# Patient Record
Sex: Male | Born: 2000 | Race: Black or African American | Hispanic: No | Marital: Single | Smoking: Never smoker
Health system: Southern US, Community
[De-identification: ages and names within clinical notes are randomized; demographics above are authoritative.]

## PROBLEM LIST (undated history)

## (undated) DIAGNOSIS — N35919 Unspecified urethral stricture, male, unspecified site: Secondary | ICD-10-CM

## (undated) DIAGNOSIS — G43909 Migraine, unspecified, not intractable, without status migrainosus: Secondary | ICD-10-CM

## (undated) HISTORY — PX: ANTERIOR CRUCIATE LIGAMENT REPAIR: SHX115

---

## 2019-03-08 DIAGNOSIS — W3400XA Accidental discharge from unspecified firearms or gun, initial encounter: Secondary | ICD-10-CM

## 2019-03-08 HISTORY — DX: Accidental discharge from unspecified firearms or gun, initial encounter: W34.00XA

## 2019-03-12 ENCOUNTER — Emergency Department (HOSPITAL_COMMUNITY): Payer: Medicaid Other | Admitting: Anesthesiology

## 2019-03-12 ENCOUNTER — Inpatient Hospital Stay (HOSPITAL_COMMUNITY): Payer: Medicaid Other

## 2019-03-12 ENCOUNTER — Other Ambulatory Visit: Payer: Self-pay

## 2019-03-12 ENCOUNTER — Emergency Department (HOSPITAL_COMMUNITY): Payer: Medicaid Other

## 2019-03-12 ENCOUNTER — Inpatient Hospital Stay (HOSPITAL_COMMUNITY)
Admission: EM | Admit: 2019-03-12 | Discharge: 2019-03-20 | DRG: 957 | Disposition: A | Discharge status: Home Health | Payer: Medicaid Other | Attending: Surgery | Admitting: Surgery

## 2019-03-12 ENCOUNTER — Encounter (HOSPITAL_COMMUNITY): Admission: EM | Disposition: A | Discharge status: Home Health | Payer: Self-pay | Source: Home / Self Care

## 2019-03-12 DIAGNOSIS — N179 Acute kidney failure, unspecified: Secondary | ICD-10-CM | POA: Diagnosis present

## 2019-03-12 DIAGNOSIS — W3400XA Accidental discharge from unspecified firearms or gun, initial encounter: Secondary | ICD-10-CM | POA: Diagnosis not present

## 2019-03-12 DIAGNOSIS — S3720XA Unspecified injury of bladder, initial encounter: Secondary | ICD-10-CM | POA: Diagnosis present

## 2019-03-12 DIAGNOSIS — Z9911 Dependence on respirator [ventilator] status: Secondary | ICD-10-CM | POA: Diagnosis not present

## 2019-03-12 DIAGNOSIS — Z20828 Contact with and (suspected) exposure to other viral communicable diseases: Secondary | ICD-10-CM | POA: Diagnosis present

## 2019-03-12 DIAGNOSIS — S32509A Unspecified fracture of unspecified pubis, initial encounter for closed fracture: Secondary | ICD-10-CM | POA: Diagnosis present

## 2019-03-12 DIAGNOSIS — R402362 Coma scale, best motor response, obeys commands, at arrival to emergency department: Secondary | ICD-10-CM | POA: Diagnosis present

## 2019-03-12 DIAGNOSIS — S36409A Unspecified injury of unspecified part of small intestine, initial encounter: Secondary | ICD-10-CM | POA: Diagnosis present

## 2019-03-12 DIAGNOSIS — S31649A Puncture wound with foreign body of abdominal wall, unspecified quadrant with penetration into peritoneal cavity, initial encounter: Principal | ICD-10-CM | POA: Diagnosis present

## 2019-03-12 DIAGNOSIS — R402142 Coma scale, eyes open, spontaneous, at arrival to emergency department: Secondary | ICD-10-CM | POA: Diagnosis present

## 2019-03-12 DIAGNOSIS — S3720XD Unspecified injury of bladder, subsequent encounter: Secondary | ICD-10-CM

## 2019-03-12 DIAGNOSIS — D62 Acute posthemorrhagic anemia: Secondary | ICD-10-CM | POA: Diagnosis not present

## 2019-03-12 DIAGNOSIS — R402113 Coma scale, eyes open, never, at hospital admission: Secondary | ICD-10-CM | POA: Diagnosis present

## 2019-03-12 DIAGNOSIS — R402252 Coma scale, best verbal response, oriented, at arrival to emergency department: Secondary | ICD-10-CM | POA: Diagnosis present

## 2019-03-12 DIAGNOSIS — R42 Dizziness and giddiness: Secondary | ICD-10-CM | POA: Diagnosis present

## 2019-03-12 DIAGNOSIS — R402343 Coma scale, best motor response, flexion withdrawal, at hospital admission: Secondary | ICD-10-CM | POA: Diagnosis present

## 2019-03-12 DIAGNOSIS — J9601 Acute respiratory failure with hypoxia: Secondary | ICD-10-CM | POA: Diagnosis not present

## 2019-03-12 DIAGNOSIS — R402233 Coma scale, best verbal response, inappropriate words, at hospital admission: Secondary | ICD-10-CM | POA: Diagnosis present

## 2019-03-12 DIAGNOSIS — S31814A Puncture wound with foreign body of right buttock, initial encounter: Secondary | ICD-10-CM | POA: Diagnosis present

## 2019-03-12 DIAGNOSIS — J939 Pneumothorax, unspecified: Secondary | ICD-10-CM

## 2019-03-12 DIAGNOSIS — K659 Peritonitis, unspecified: Secondary | ICD-10-CM | POA: Diagnosis present

## 2019-03-12 DIAGNOSIS — J9690 Respiratory failure, unspecified, unspecified whether with hypoxia or hypercapnia: Secondary | ICD-10-CM

## 2019-03-12 DIAGNOSIS — Z9289 Personal history of other medical treatment: Secondary | ICD-10-CM

## 2019-03-12 DIAGNOSIS — S31139A Puncture wound of abdominal wall without foreign body, unspecified quadrant without penetration into peritoneal cavity, initial encounter: Secondary | ICD-10-CM

## 2019-03-12 DIAGNOSIS — J9811 Atelectasis: Secondary | ICD-10-CM

## 2019-03-12 DIAGNOSIS — Z01818 Encounter for other preprocedural examination: Secondary | ICD-10-CM

## 2019-03-12 HISTORY — PX: PROCTOSCOPY: SHX2266

## 2019-03-12 HISTORY — PX: BOWEL RESECTION: SHX1257

## 2019-03-12 HISTORY — PX: ILEO LOOP DIVERSION: SHX1780

## 2019-03-12 HISTORY — PX: APPLICATION OF WOUND VAC: SHX5189

## 2019-03-12 HISTORY — PX: LAPAROTOMY: SHX154

## 2019-03-12 HISTORY — PX: BLADDER REPAIR: SHX6721

## 2019-03-12 LAB — CBC
HCT: 41.1 % (ref 39.0–52.0)
Hemoglobin: 14.1 g/dL (ref 13.0–17.0)
MCH: 29.7 pg (ref 26.0–34.0)
MCHC: 34.3 g/dL (ref 30.0–36.0)
MCV: 86.5 fL (ref 80.0–100.0)
Platelets: 212 10*3/uL (ref 150–400)
RBC: 4.75 MIL/uL (ref 4.22–5.81)
RDW: 12.6 % (ref 11.5–15.5)
WBC: 9.8 10*3/uL (ref 4.0–10.5)
nRBC: 0 % (ref 0.0–0.2)

## 2019-03-12 LAB — PROTIME-INR
INR: 3.2 — ABNORMAL HIGH (ref 0.8–1.2)
Prothrombin Time: 31.9 seconds — ABNORMAL HIGH (ref 11.4–15.2)

## 2019-03-12 LAB — SARS CORONAVIRUS 2 BY RT PCR (HOSPITAL ORDER, PERFORMED IN ~~LOC~~ HOSPITAL LAB): SARS Coronavirus 2: NEGATIVE

## 2019-03-12 LAB — SAMPLE TO BLOOD BANK

## 2019-03-12 LAB — ETHANOL: Alcohol, Ethyl (B): <10 mg/dL (ref <10)

## 2019-03-12 LAB — CDS SEROLOGY

## 2019-03-12 LAB — LACTIC ACID, PLASMA: Lactic Acid, Venous: 2.9 mmol/L (ref 0.5–1.9)

## 2019-03-12 SURGERY — LAPAROTOMY, EXPLORATORY
Anesthesia: General | Site: Rectum

## 2019-03-12 MED ORDER — SUCCINYLCHOLINE 20MG/ML (10ML) SYRINGE FOR MEDFUSION PUMP - OPTIME
INTRAMUSCULAR | Status: DC | PRN
  Administered 2019-03-12: 100 mg via INTRAVENOUS

## 2019-03-12 MED ORDER — FENTANYL CITRATE (PF) 250 MCG/5ML IJ SOLN
INTRAMUSCULAR | Status: AC
  Filled 2019-03-12: qty 5

## 2019-03-12 MED ORDER — SODIUM CHLORIDE 0.9 % IV SOLN
INTRAVENOUS | Status: DC | PRN
  Administered 2019-03-12: 21:00:00 via INTRAVENOUS

## 2019-03-12 MED ORDER — HYDROMORPHONE HCL 1 MG/ML IJ SOLN
INTRAMUSCULAR | Status: AC
  Administered 2019-03-12: 1 mg via INTRAVENOUS
  Filled 2019-03-12: qty 1

## 2019-03-12 MED ORDER — HEMOSTATIC AGENTS (NO CHARGE) OPTIME
TOPICAL | Status: DC | PRN
  Administered 2019-03-12: 1

## 2019-03-12 MED ORDER — CEFAZOLIN SODIUM-DEXTROSE 2-3 GM-%(50ML) IV SOLR
INTRAVENOUS | Status: DC | PRN
  Administered 2019-03-12: 2 g via INTRAVENOUS

## 2019-03-12 MED ORDER — ONDANSETRON HCL 4 MG/2ML IJ SOLN
4.0000 mg | Freq: Four times a day (QID) | INTRAMUSCULAR | Status: DC | PRN
  Administered 2019-03-14 – 2019-03-19 (×5): 4 mg via INTRAVENOUS
  Filled 2019-03-12 (×5): qty 2

## 2019-03-12 MED ORDER — SODIUM CHLORIDE 0.9 % IV SOLN
INTRAVENOUS | Status: DC
  Administered 2019-03-12 – 2019-03-19 (×13): via INTRAVENOUS

## 2019-03-12 MED ORDER — ROCURONIUM 10MG/ML (10ML) SYRINGE FOR MEDFUSION PUMP - OPTIME
INTRAVENOUS | Status: DC | PRN
  Administered 2019-03-12: 50 mg via INTRAVENOUS
  Administered 2019-03-12: 30 mg via INTRAVENOUS
  Administered 2019-03-12: 20 mg via INTRAVENOUS

## 2019-03-12 MED ORDER — HYDROMORPHONE HCL 1 MG/ML IJ SOLN
1.0000 mg | Freq: Once | INTRAMUSCULAR | Status: AC
  Administered 2019-03-12: 21:00:00 1 mg via INTRAVENOUS

## 2019-03-12 MED ORDER — CHLORHEXIDINE GLUCONATE CLOTH 2 % EX PADS
6.0000 | MEDICATED_PAD | Freq: Every day | CUTANEOUS | Status: DC
  Administered 2019-03-13 – 2019-03-20 (×7): 6 via TOPICAL

## 2019-03-12 MED ORDER — HEMOSTATIC AGENTS (NO CHARGE) OPTIME
TOPICAL | Status: DC | PRN
  Administered 2019-03-12 (×3): 1 via TOPICAL
  Administered 2019-03-12: 1

## 2019-03-12 MED ORDER — MIDAZOLAM HCL 2 MG/2ML IJ SOLN
INTRAMUSCULAR | Status: DC | PRN
  Administered 2019-03-12: 2 mg via INTRAVENOUS

## 2019-03-12 MED ORDER — ORAL CARE MOUTH RINSE
15.0000 mL | OROMUCOSAL | Status: DC
  Administered 2019-03-12 – 2019-03-15 (×24): 15 mL via OROMUCOSAL

## 2019-03-12 MED ORDER — FENTANYL CITRATE (PF) 250 MCG/5ML IJ SOLN
INTRAMUSCULAR | Status: DC | PRN
  Administered 2019-03-12: 150 ug via INTRAVENOUS
  Administered 2019-03-12 (×3): 100 ug via INTRAVENOUS
  Administered 2019-03-12: 50 ug via INTRAVENOUS
  Administered 2019-03-12: 150 ug via INTRAVENOUS
  Administered 2019-03-12: 100 ug via INTRAVENOUS

## 2019-03-12 MED ORDER — DOCUSATE SODIUM 50 MG/5ML PO LIQD: 100.0000 mg | Freq: Two times a day (BID) | ORAL | Status: DC | PRN

## 2019-03-12 MED ORDER — LACTATED RINGERS IV SOLN
INTRAVENOUS | Status: DC | PRN
  Administered 2019-03-12: 22:00:00 via INTRAVENOUS

## 2019-03-12 MED ORDER — PROPOFOL 10 MG/ML IV BOLUS
INTRAVENOUS | Status: DC | PRN
  Administered 2019-03-12: 120 mg via INTRAVENOUS

## 2019-03-12 MED ORDER — PROPOFOL 1000 MG/100ML IV EMUL
0.0000 ug/kg/min | INTRAVENOUS | Status: DC
  Administered 2019-03-12: 35 ug/kg/min via INTRAVENOUS
  Administered 2019-03-13: 30 ug/kg/min via INTRAVENOUS
  Administered 2019-03-13 – 2019-03-15 (×12): 50 ug/kg/min via INTRAVENOUS
  Filled 2019-03-12 (×10): qty 100

## 2019-03-12 MED ORDER — ONDANSETRON 4 MG PO TBDP: 4.0000 mg | ORAL_TABLET | Freq: Four times a day (QID) | ORAL | Status: DC | PRN

## 2019-03-12 MED ORDER — CHLORHEXIDINE GLUCONATE 0.12% ORAL RINSE (MEDLINE KIT)
15.0000 mL | Freq: Two times a day (BID) | OROMUCOSAL | Status: DC
  Administered 2019-03-12 – 2019-03-15 (×6): 15 mL via OROMUCOSAL

## 2019-03-12 MED ORDER — 0.9 % SODIUM CHLORIDE (POUR BTL) OPTIME
TOPICAL | Status: DC | PRN
  Administered 2019-03-12 (×2): 1000 mL

## 2019-03-12 MED ORDER — PROPOFOL 500 MG/50ML IV EMUL
INTRAVENOUS | Status: DC | PRN
  Administered 2019-03-12: 60 ug/kg/min via INTRAVENOUS

## 2019-03-12 MED ORDER — FENTANYL 2500MCG IN NS 250ML (10MCG/ML) PREMIX INFUSION
0.0000 ug/h | INTRAVENOUS | Status: DC
  Administered 2019-03-12: 150 ug/h via INTRAVENOUS
  Administered 2019-03-13 – 2019-03-15 (×9): 400 ug/h via INTRAVENOUS
  Filled 2019-03-12 (×10): qty 250

## 2019-03-12 SURGICAL SUPPLY — 72 items
BANDAGE HEMOSTAT MRDH 4X4 STRL (MISCELLANEOUS) IMPLANT
BLADE CLIPPER SURG (BLADE) IMPLANT
BNDG HEMOSTAT MRDH 4X4 STRL (MISCELLANEOUS) ×6
CANISTER SUCT 3000ML PPV (MISCELLANEOUS) ×6 IMPLANT
CANISTER WOUND CARE 500ML ATS (WOUND CARE) ×2 IMPLANT
CATH ROBINSON RED A/P 14FR (CATHETERS) ×2 IMPLANT
CHLORAPREP W/TINT 26 (MISCELLANEOUS) ×6 IMPLANT
CONT SPEC 4OZ CLIKSEAL STRL BL (MISCELLANEOUS) ×2 IMPLANT
COVER SURGICAL LIGHT HANDLE (MISCELLANEOUS) ×6 IMPLANT
COVER WAND RF STERILE (DRAPES) ×6 IMPLANT
DRAIN CHANNEL 19F RND (DRAIN) IMPLANT
DRAPE LAPAROSCOPIC ABDOMINAL (DRAPES) ×6 IMPLANT
DRAPE WARM FLUID 44X44 (DRAPES) ×6 IMPLANT
DRSG OPSITE POSTOP 4X10 (GAUZE/BANDAGES/DRESSINGS) IMPLANT
DRSG OPSITE POSTOP 4X8 (GAUZE/BANDAGES/DRESSINGS) IMPLANT
DURAPREP 26ML APPLICATOR (WOUND CARE) ×2 IMPLANT
ELECT BLADE 6.5 EXT (BLADE) IMPLANT
ELECT CAUTERY BLADE 6.4 (BLADE) IMPLANT
ELECT REM PT RETURN 9FT ADLT (ELECTROSURGICAL) ×6
ELECTRODE REM PT RTRN 9FT ADLT (ELECTROSURGICAL) ×4 IMPLANT
EVACUATOR SILICONE 100CC (DRAIN) IMPLANT
GLOVE BIO SURGEON STRL SZ7 (GLOVE) ×6 IMPLANT
GLOVE BIOGEL PI IND STRL 6.5 (GLOVE) IMPLANT
GLOVE BIOGEL PI IND STRL 7.0 (GLOVE) IMPLANT
GLOVE BIOGEL PI IND STRL 7.5 (GLOVE) ×4 IMPLANT
GLOVE BIOGEL PI INDICATOR 6.5 (GLOVE) ×2
GLOVE BIOGEL PI INDICATOR 7.0 (GLOVE) ×2
GLOVE BIOGEL PI INDICATOR 7.5 (GLOVE) ×2
GLOVE SS N UNI LF 7.0 STRL (GLOVE) ×2 IMPLANT
GLOVE SURG SS PI 7.0 STRL IVOR (GLOVE) ×2 IMPLANT
GLOVE SURG SS PI 8.0 STRL IVOR (GLOVE) ×2 IMPLANT
GOWN STRL REUS W/ TWL LRG LVL3 (GOWN DISPOSABLE) ×8 IMPLANT
GOWN STRL REUS W/TWL LRG LVL3 (GOWN DISPOSABLE) ×4
HANDLE SUCTION POOLE (INSTRUMENTS) ×4 IMPLANT
HEMOSTAT SNOW SURGICEL 2X4 (HEMOSTASIS) ×8 IMPLANT
KIT BASIN OR (CUSTOM PROCEDURE TRAY) ×6 IMPLANT
KIT OSTOMY DRAINABLE 2.75 STR (WOUND CARE) ×2 IMPLANT
KIT TURNOVER KIT B (KITS) ×6 IMPLANT
LIGASURE IMPACT 36 18CM CVD LR (INSTRUMENTS) ×2 IMPLANT
NS IRRIG 1000ML POUR BTL (IV SOLUTION) ×12 IMPLANT
PACK GENERAL/GYN (CUSTOM PROCEDURE TRAY) ×6 IMPLANT
PAD ARMBOARD 7.5X6 YLW CONV (MISCELLANEOUS) ×6 IMPLANT
PENCIL SMOKE EVACUATOR (MISCELLANEOUS) ×6 IMPLANT
RELOAD PROXIMATE 75MM BLUE (ENDOMECHANICALS) ×18 IMPLANT
RELOAD STAPLE 75 3.8 BLU REG (ENDOMECHANICALS) IMPLANT
SPECIMEN JAR LARGE (MISCELLANEOUS) IMPLANT
SPONGE ABD ABTHERA ADVANCE (MISCELLANEOUS) ×4 IMPLANT
SPONGE LAP 18X18 RF (DISPOSABLE) ×14 IMPLANT
STAPLER GUN LINEAR PROX 60 (STAPLE) ×2 IMPLANT
STAPLER PROXIMATE 75MM BLUE (STAPLE) ×2 IMPLANT
STAPLER VISISTAT 35W (STAPLE) ×6 IMPLANT
SUCTION POOLE HANDLE (INSTRUMENTS) ×6
SUT ETHILON 2 0 FS 18 (SUTURE) ×4 IMPLANT
SUT PDS AB 1 TP1 96 (SUTURE) ×12 IMPLANT
SUT SILK 2 0 (SUTURE) ×2
SUT SILK 2 0 SH CR/8 (SUTURE) ×6 IMPLANT
SUT SILK 2-0 18XBRD TIE 12 (SUTURE) ×4 IMPLANT
SUT SILK 3 0 (SUTURE) ×2
SUT SILK 3 0 SH 30 (SUTURE) ×6 IMPLANT
SUT SILK 3 0 SH CR/8 (SUTURE) ×8 IMPLANT
SUT SILK 3-0 18XBRD TIE 12 (SUTURE) ×4 IMPLANT
SUT VIC AB 2-0 CT1 27 (SUTURE) ×2
SUT VIC AB 2-0 CT1 TAPERPNT 27 (SUTURE) IMPLANT
SUT VIC AB 2-0 SH 18 (SUTURE) ×2 IMPLANT
SUT VIC AB 2-0 UR6 27 (SUTURE) ×2 IMPLANT
SUT VIC AB 3-0 SH 18 (SUTURE) ×4 IMPLANT
SUT VIC AB 3-0 SH 27 (SUTURE)
SUT VIC AB 3-0 SH 27X BRD (SUTURE) IMPLANT
SUT VICRYL AB 2 0 TIES (SUTURE) ×2 IMPLANT
TOWEL GREEN STERILE (TOWEL DISPOSABLE) ×6 IMPLANT
TRAY FOLEY MTR SLVR 16FR STAT (SET/KITS/TRAYS/PACK) ×8 IMPLANT
YANKAUER SUCT BULB TIP NO VENT (SUCTIONS) IMPLANT
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
## 2019-03-12 NOTE — Anesthesia Preprocedure Evaluation (Addendum)
Anesthesia Evaluation  Patient identified by MRN, date of birth, ID band Patient awake    Reviewed: Allergy & Precautions, H&P , NPO status , Patient's Chart, lab work & pertinent test results  Airway Mallampati: II  TM Distance: >3 FB Neck ROM: Full    Dental no notable dental hx. (+) Teeth Intact, Dental Advisory Given   Pulmonary neg pulmonary ROS,    Pulmonary exam normal breath sounds clear to auscultation       Cardiovascular negative cardio ROS   Rhythm:Regular Rate:Tachycardia     Neuro/Psych negative neurological ROS  negative psych ROS   GI/Hepatic negative GI ROS, Neg liver ROS,   Endo/Other  negative endocrine ROS  Renal/GU negative Renal ROS  negative genitourinary   Musculoskeletal   Abdominal   Peds  Hematology negative hematology ROS (+)   Anesthesia Other Findings   Reproductive/Obstetrics negative OB ROS                            Anesthesia Physical Anesthesia Plan  ASA: I and emergent  Anesthesia Plan: General   Post-op Pain Management:    Induction: Intravenous, Rapid sequence and Cricoid pressure planned  PONV Risk Score and Plan: 4 or greater and Ondansetron, Dexamethasone and Midazolam  Airway Management Planned: Oral ETT  Additional Equipment: Arterial line  Intra-op Plan:   Post-operative Plan: Possible Post-op intubation/ventilation  Informed Consent: I have reviewed the patients History and Physical, chart, labs and discussed the procedure including the risks, benefits and alternatives for the proposed anesthesia with the patient or authorized representative who has indicated his/her understanding and acceptance.     Dental advisory given  Plan Discussed with: CRNA  Anesthesia Plan Comments:        Anesthesia Quick Evaluation
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
## 2019-03-12 NOTE — Progress Notes (Signed)
Chaplain responded to page. Will be available as needed.  Tamsen Snider Pager [phone]
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
## 2019-03-12 NOTE — Anesthesia Procedure Notes (Signed)
Arterial Line Insertion Start/End10/10/2018 9:10 PM, 03/12/2019 9:16 PM Performed by: Roderic Palau, MD  Patient location: Pre-op. Preanesthetic checklist: patient identified, IV checked, site marked, risks and benefits discussed, surgical consent, monitors and equipment checked, pre-op evaluation, timeout performed and anesthesia consent Lidocaine 1% used for infiltration Right, radial was placed Catheter size: 20 Fr Hand hygiene performed , maximum sterile barriers used  and Seldinger technique used  Attempts: 1 Procedure performed without using ultrasound guided technique. Following insertion, dressing applied and Biopatch. Post procedure assessment: normal and unchanged  Patient tolerated the procedure well with no immediate complications.

## 2019-03-12 NOTE — Anesthesia Procedure Notes (Signed)
Procedure Name: Intubation Date/Time: 03/12/2019 9:04 PM Performed by: Valetta Fuller, CRNA Pre-anesthesia Checklist: Patient identified, Emergency Drugs available, Suction available and Patient being monitored Patient Re-evaluated:Patient Re-evaluated prior to induction Oxygen Delivery Method: Circle system utilized Preoxygenation: Pre-oxygenation with 100% oxygen Induction Type: IV induction, Rapid sequence and Cricoid Pressure applied Laryngoscope Size: Miller and 2 Grade View: Grade I Tube type: Oral Tube size: 7.5 mm Number of attempts: 1 Airway Equipment and Method: Stylet Placement Confirmation: ETT inserted through vocal cords under direct vision,  positive ETCO2 and breath sounds checked- equal and bilateral Secured at: 23 cm Tube secured with: Tape Dental Injury: Teeth and Oropharynx as per pre-operative assessment
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
## 2019-03-12 NOTE — Transfer of Care (Signed)
Immediate Anesthesia Transfer of Care Note  Patient: Dao Memmott  Procedure(s) Performed: EXPLORATORY LAPAROTOMY (N/A ) Rigid Proctoscopy, Removal of foreign body in Rectum (N/A Rectum) Application Of Abdominal Wound Vac (Abdomen) Ileo Loop Colostomy (Abdomen) Small Bowel Resection with anastomosis (Abdomen) Bladder Repair (N/A Bladder)  Patient Location: NICU  Anesthesia Type:General  Level of Consciousness: Patient remains intubated per anesthesia plan  Airway & Oxygen Therapy: Patient placed on Ventilator (see vital sign flow sheet for setting)  Post-op Assessment: Report given to RN and Post -op Vital signs reviewed and stable  Post vital signs: Reviewed and stable  Last Vitals:  Vitals Value Taken Time  BP 208/131 03/12/19 2322  Temp 36.6 C 03/12/19 2315  Pulse 79 03/12/19 2325  Resp 17 03/12/19 2325  SpO2 100 % 03/12/19 2325  Vitals shown include unvalidated device data.  Last Pain:  Vitals:   03/12/19 2315  TempSrc: Oral  PainSc:          Complications: No apparent anesthesia complications
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
## 2019-03-13 ENCOUNTER — Inpatient Hospital Stay (HOSPITAL_COMMUNITY): Payer: Medicaid Other

## 2019-03-13 ENCOUNTER — Encounter (HOSPITAL_COMMUNITY): Payer: Self-pay | Admitting: General Surgery

## 2019-03-13 LAB — CBC
HCT: 24.3 % — ABNORMAL LOW (ref 39.0–52.0)
HCT: 34.8 % — ABNORMAL LOW (ref 39.0–52.0)
HCT: 41 % (ref 39.0–52.0)
Hemoglobin: 12 g/dL — ABNORMAL LOW (ref 13.0–17.0)
Hemoglobin: 14 g/dL (ref 13.0–17.0)
Hemoglobin: 8.7 g/dL — ABNORMAL LOW (ref 13.0–17.0)
MCH: 29.2 pg (ref 26.0–34.0)
MCH: 29.5 pg (ref 26.0–34.0)
MCH: 29.8 pg (ref 26.0–34.0)
MCHC: 34.1 g/dL (ref 30.0–36.0)
MCHC: 34.5 g/dL (ref 30.0–36.0)
MCHC: 35.8 g/dL (ref 30.0–36.0)
MCV: 83.2 fL (ref 80.0–100.0)
MCV: 85.4 fL (ref 80.0–100.0)
MCV: 85.5 fL (ref 80.0–100.0)
Platelets: 148 10*3/uL — ABNORMAL LOW (ref 150–400)
Platelets: 202 10*3/uL (ref 150–400)
Platelets: 267 10*3/uL (ref 150–400)
RBC: 2.92 MIL/uL — ABNORMAL LOW (ref 4.22–5.81)
RBC: 4.07 MIL/uL — ABNORMAL LOW (ref 4.22–5.81)
RBC: 4.8 MIL/uL (ref 4.22–5.81)
RDW: 12.7 % (ref 11.5–15.5)
RDW: 12.7 % (ref 11.5–15.5)
RDW: 12.7 % (ref 11.5–15.5)
WBC: 5.4 10*3/uL (ref 4.0–10.5)
WBC: 6.2 10*3/uL (ref 4.0–10.5)
WBC: 7.8 10*3/uL (ref 4.0–10.5)
nRBC: 0 % (ref 0.0–0.2)
nRBC: 0 % (ref 0.0–0.2)
nRBC: 0 % (ref 0.0–0.2)

## 2019-03-13 LAB — POCT I-STAT 7, (LYTES, BLD GAS, ICA,H+H)
Acid-base deficit: 3 mmol/L — ABNORMAL HIGH (ref 0.0–2.0)
Acid-base deficit: 3 mmol/L — ABNORMAL HIGH (ref 0.0–2.0)
Bicarbonate: 21.7 mmol/L (ref 20.0–28.0)
Bicarbonate: 22.9 mmol/L (ref 20.0–28.0)
Calcium, Ion: 1.09 mmol/L — ABNORMAL LOW (ref 1.15–1.40)
Calcium, Ion: 1.16 mmol/L (ref 1.15–1.40)
HCT: 39 % (ref 39.0–52.0)
HCT: 39 % (ref 39.0–52.0)
Hemoglobin: 13.3 g/dL (ref 13.0–17.0)
Hemoglobin: 13.3 g/dL (ref 13.0–17.0)
O2 Saturation: 100 %
O2 Saturation: 100 %
Patient temperature: 35.4
Patient temperature: 97.6
Potassium: 3.7 mmol/L (ref 3.5–5.1)
Potassium: 3.7 mmol/L (ref 3.5–5.1)
Sodium: 140 mmol/L (ref 135–145)
Sodium: 140 mmol/L (ref 135–145)
TCO2: 23 mmol/L (ref 22–32)
TCO2: 24 mmol/L (ref 22–32)
pCO2 arterial: 36.4 mmHg (ref 32.0–48.0)
pCO2 arterial: 41.2 mmHg (ref 32.0–48.0)
pH, Arterial: 7.346 — ABNORMAL LOW (ref 7.350–7.450)
pH, Arterial: 7.381 (ref 7.350–7.450)
pO2, Arterial: 216 mmHg — ABNORMAL HIGH (ref 83.0–108.0)
pO2, Arterial: 578 mmHg — ABNORMAL HIGH (ref 83.0–108.0)

## 2019-03-13 LAB — BASIC METABOLIC PANEL
Anion gap: 5 (ref 5–15)
Anion gap: 9 (ref 5–15)
BUN: 13 mg/dL (ref 6–20)
BUN: 13 mg/dL (ref 6–20)
CO2: 20 mmol/L — ABNORMAL LOW (ref 22–32)
CO2: 21 mmol/L — ABNORMAL LOW (ref 22–32)
Calcium: 7.4 mg/dL — ABNORMAL LOW (ref 8.9–10.3)
Calcium: 7.5 mg/dL — ABNORMAL LOW (ref 8.9–10.3)
Chloride: 109 mmol/L (ref 98–111)
Chloride: 112 mmol/L — ABNORMAL HIGH (ref 98–111)
Creatinine, Ser: 1.18 mg/dL (ref 0.61–1.24)
Creatinine, Ser: 1.23 mg/dL (ref 0.61–1.24)
GFR calc Af Amer: >60 mL/min (ref >60)
GFR calc Af Amer: >60 mL/min (ref >60)
GFR calc non Af Amer: >60 mL/min (ref >60)
GFR calc non Af Amer: >60 mL/min (ref >60)
Glucose, Bld: 128 mg/dL — ABNORMAL HIGH (ref 70–99)
Glucose, Bld: 180 mg/dL — ABNORMAL HIGH (ref 70–99)
Potassium: 3.7 mmol/L (ref 3.5–5.1)
Potassium: 4.5 mmol/L (ref 3.5–5.1)
Sodium: 138 mmol/L (ref 135–145)
Sodium: 138 mmol/L (ref 135–145)

## 2019-03-13 LAB — HIV ANTIBODY (ROUTINE TESTING W REFLEX): HIV Screen 4th Generation wRfx: NONREACTIVE

## 2019-03-13 LAB — HEMOGLOBIN AND HEMATOCRIT, BLOOD
HCT: 29.7 % — ABNORMAL LOW (ref 39.0–52.0)
Hemoglobin: 10.3 g/dL — ABNORMAL LOW (ref 13.0–17.0)

## 2019-03-13 LAB — LACTIC ACID, PLASMA: Lactic Acid, Venous: 1.9 mmol/L (ref 0.5–1.9)

## 2019-03-13 LAB — PROTIME-INR
INR: 1.3 — ABNORMAL HIGH (ref 0.8–1.2)
Prothrombin Time: 16.4 seconds — ABNORMAL HIGH (ref 11.4–15.2)

## 2019-03-13 LAB — ABO/RH: ABO/RH(D): O POS

## 2019-03-13 LAB — TRIGLYCERIDES: Triglycerides: 55 mg/dL (ref <150)

## 2019-03-13 LAB — MRSA PCR SCREENING: MRSA by PCR: NEGATIVE

## 2019-03-13 LAB — PREPARE RBC (CROSSMATCH)

## 2019-03-13 MED ORDER — ALBUMIN HUMAN 5 % IV SOLN
25.0000 g | Freq: Once | INTRAVENOUS | Status: AC
  Administered 2019-03-13: 08:00:00 25 g via INTRAVENOUS

## 2019-03-13 MED ORDER — PANTOPRAZOLE SODIUM 40 MG IV SOLR
40.0000 mg | INTRAVENOUS | Status: DC
  Administered 2019-03-13 – 2019-03-16 (×4): 40 mg via INTRAVENOUS
  Filled 2019-03-13 (×4): qty 40

## 2019-03-13 MED ORDER — ALBUMIN HUMAN 5 % IV SOLN
25.0000 g | Freq: Once | INTRAVENOUS | Status: AC
  Administered 2019-03-13: 10:00:00 25 g via INTRAVENOUS
  Filled 2019-03-13: qty 500

## 2019-03-13 MED ORDER — IOHEXOL 300 MG/ML  SOLN
100.0000 mL | Freq: Once | INTRAMUSCULAR | Status: AC | PRN
  Administered 2019-03-13: 01:00:00 100 mL via INTRAVENOUS

## 2019-03-13 MED ORDER — PIPERACILLIN-TAZOBACTAM 3.375 G IVPB
3.3750 g | Freq: Three times a day (TID) | INTRAVENOUS | Status: DC
  Administered 2019-03-13 – 2019-03-16 (×9): 3.375 g via INTRAVENOUS
  Filled 2019-03-13 (×10): qty 50

## 2019-03-13 MED ORDER — SODIUM CHLORIDE 0.9 % IV BOLUS
1000.0000 mL | Freq: Once | INTRAVENOUS | Status: AC
  Administered 2019-03-13: 07:00:00 1000 mL via INTRAVENOUS

## 2019-03-13 MED ORDER — NOREPINEPHRINE 4 MG/250ML-% IV SOLN
0.0000 ug/min | INTRAVENOUS | Status: DC
  Administered 2019-03-13: 2 ug/min via INTRAVENOUS
  Filled 2019-03-13: qty 250

## 2019-03-13 MED ORDER — SODIUM CHLORIDE 0.9% IV SOLUTION
Freq: Once | INTRAVENOUS | Status: AC
  Administered 2019-03-13: 16:00:00 via INTRAVENOUS
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
## 2019-03-13 NOTE — Progress Notes (Signed)
Pt transported from 4N32 to CT and back with no complications.  

## 2019-03-13 NOTE — Progress Notes (Signed)
FiO2 dropped to 30% per ABG results. Pt tolerating well.
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
## 2019-03-13 NOTE — Anesthesia Postprocedure Evaluation (Signed)
Anesthesia Post Note  Patient: Harrold Donath  Procedure(s) Performed: EXPLORATORY LAPAROTOMY (N/A Abdomen) Rigid Proctoscopy, Removal of foreign body in Rectum (N/A Rectum) Application Of Abdominal Wound Vac (Abdomen) Ileo Loop Colostomy (Abdomen) Small Bowel Resection with anastomosis (Abdomen) Bladder Repair (N/A Bladder)     Patient location during evaluation: SICU Anesthesia Type: General Level of consciousness: sedated Pain management: pain level controlled Vital Signs Assessment: post-procedure vital signs reviewed and stable Respiratory status: patient remains intubated per anesthesia plan Cardiovascular status: stable Postop Assessment: no apparent nausea or vomiting Anesthetic complications: no    Last Vitals:  Vitals:   03/13/19 0600 03/13/19 0700  BP: (!) 88/53 (!) 92/54  Pulse: 99 88  Resp: 18   Temp:    SpO2: 100% 100%    Last Pain:  Vitals:   03/13/19 0400  TempSrc: Axillary  PainSc:                  Arjuna Doeden,W. EDMOND

## 2019-03-14 ENCOUNTER — Encounter (HOSPITAL_COMMUNITY): Admission: EM | Disposition: A | Discharge status: Home Health | Payer: Self-pay | Source: Home / Self Care

## 2019-03-14 ENCOUNTER — Inpatient Hospital Stay (HOSPITAL_COMMUNITY): Payer: Medicaid Other | Admitting: Anesthesiology

## 2019-03-14 ENCOUNTER — Inpatient Hospital Stay (HOSPITAL_COMMUNITY): Payer: Medicaid Other

## 2019-03-14 HISTORY — PX: LAPAROTOMY: SHX154

## 2019-03-14 HISTORY — PX: WOUND DEBRIDEMENT: SHX247

## 2019-03-14 LAB — BASIC METABOLIC PANEL
Anion gap: 6 (ref 5–15)
BUN: 9 mg/dL (ref 6–20)
CO2: 19 mmol/L — ABNORMAL LOW (ref 22–32)
Calcium: 7.8 mg/dL — ABNORMAL LOW (ref 8.9–10.3)
Chloride: 112 mmol/L — ABNORMAL HIGH (ref 98–111)
Creatinine, Ser: 1.3 mg/dL — ABNORMAL HIGH (ref 0.61–1.24)
GFR calc Af Amer: >60 mL/min (ref >60)
GFR calc non Af Amer: >60 mL/min (ref >60)
Glucose, Bld: 86 mg/dL (ref 70–99)
Potassium: 3.4 mmol/L — ABNORMAL LOW (ref 3.5–5.1)
Sodium: 137 mmol/L (ref 135–145)

## 2019-03-14 LAB — CBC
HCT: 30.5 % — ABNORMAL LOW (ref 39.0–52.0)
Hemoglobin: 10.2 g/dL — ABNORMAL LOW (ref 13.0–17.0)
MCH: 28.7 pg (ref 26.0–34.0)
MCHC: 33.4 g/dL (ref 30.0–36.0)
MCV: 85.7 fL (ref 80.0–100.0)
Platelets: 131 10*3/uL — ABNORMAL LOW (ref 150–400)
RBC: 3.56 MIL/uL — ABNORMAL LOW (ref 4.22–5.81)
RDW: 13.5 % (ref 11.5–15.5)
WBC: 6.3 10*3/uL (ref 4.0–10.5)
nRBC: 0 % (ref 0.0–0.2)

## 2019-03-14 LAB — TRIGLYCERIDES: Triglycerides: 191 mg/dL — ABNORMAL HIGH (ref <150)

## 2019-03-14 LAB — SURGICAL PATHOLOGY

## 2019-03-14 SURGERY — IRRIGATION AND DEBRIDEMENT, WOUND, ABDOMEN, WITH CLOSURE
Anesthesia: General | Site: Abdomen

## 2019-03-14 MED ORDER — LACTATED RINGERS IV SOLN
INTRAVENOUS | Status: DC | PRN
  Administered 2019-03-14: 11:00:00 via INTRAVENOUS

## 2019-03-14 MED ORDER — MIDAZOLAM HCL 2 MG/2ML IJ SOLN
INTRAMUSCULAR | Status: AC
  Filled 2019-03-14: qty 2

## 2019-03-14 MED ORDER — 0.9 % SODIUM CHLORIDE (POUR BTL) OPTIME
TOPICAL | Status: DC | PRN
  Administered 2019-03-14: 1000 mL

## 2019-03-14 MED ORDER — STERILE WATER FOR IRRIGATION IR SOLN
Status: DC | PRN
  Administered 2019-03-14: 1000 mL

## 2019-03-14 MED ORDER — IPRATROPIUM-ALBUTEROL 0.5-2.5 (3) MG/3ML IN SOLN
3.0000 mL | Freq: Four times a day (QID) | RESPIRATORY_TRACT | Status: DC
  Administered 2019-03-14 – 2019-03-15 (×7): 3 mL via RESPIRATORY_TRACT
  Filled 2019-03-14 (×7): qty 3

## 2019-03-14 MED ORDER — MUPIROCIN 2 % EX OINT: 1.0000 "application " | TOPICAL_OINTMENT | Freq: Two times a day (BID) | CUTANEOUS | Status: DC

## 2019-03-14 MED ORDER — PROPOFOL 10 MG/ML IV BOLUS
INTRAVENOUS | Status: DC | PRN
  Administered 2019-03-14: 50 mg via INTRAVENOUS

## 2019-03-14 MED ORDER — FENTANYL CITRATE (PF) 250 MCG/5ML IJ SOLN
INTRAMUSCULAR | Status: DC | PRN
  Administered 2019-03-14 (×3): 50 ug via INTRAVENOUS

## 2019-03-14 MED ORDER — ROCURONIUM BROMIDE 10 MG/ML (PF) SYRINGE
PREFILLED_SYRINGE | INTRAVENOUS | Status: DC | PRN
  Administered 2019-03-14: 30 mg via INTRAVENOUS
  Administered 2019-03-14: 70 mg via INTRAVENOUS

## 2019-03-14 MED ORDER — PHENYLEPHRINE 40 MCG/ML (10ML) SYRINGE FOR IV PUSH (FOR BLOOD PRESSURE SUPPORT)
PREFILLED_SYRINGE | INTRAVENOUS | Status: DC | PRN
  Administered 2019-03-14: 40 ug via INTRAVENOUS
  Administered 2019-03-14: 80 ug via INTRAVENOUS

## 2019-03-14 MED ORDER — FENTANYL CITRATE (PF) 250 MCG/5ML IJ SOLN
INTRAMUSCULAR | Status: AC
  Filled 2019-03-14: qty 5

## 2019-03-14 MED ORDER — MIDAZOLAM HCL 2 MG/2ML IJ SOLN
INTRAMUSCULAR | Status: DC | PRN
  Administered 2019-03-14: 2 mg via INTRAVENOUS

## 2019-03-14 MED ORDER — SODIUM CHLORIDE 0.9 % IV BOLUS
500.0000 mL | Freq: Once | INTRAVENOUS | Status: AC
  Administered 2019-03-14: 16:00:00 500 mL via INTRAVENOUS

## 2019-03-14 SURGICAL SUPPLY — 36 items
BIOPATCH RED 1 DISK 7.0 (GAUZE/BANDAGES/DRESSINGS) ×4 IMPLANT
BIOPATCH RED 1IN DISK 7.0MM (GAUZE/BANDAGES/DRESSINGS) ×2
BNDG GAUZE ELAST 4 BULKY (GAUZE/BANDAGES/DRESSINGS) ×6 IMPLANT
CANISTER SUCT 3000ML PPV (MISCELLANEOUS) ×3 IMPLANT
COVER SURGICAL LIGHT HANDLE (MISCELLANEOUS) ×3 IMPLANT
COVER WAND RF STERILE (DRAPES) ×3 IMPLANT
DRAIN CHANNEL 19F RND (DRAIN) ×6 IMPLANT
DRAPE LAPAROSCOPIC ABDOMINAL (DRAPES) ×3 IMPLANT
DRSG PAD ABDOMINAL 8X10 ST (GAUZE/BANDAGES/DRESSINGS) ×3 IMPLANT
DRSG TEGADERM 4X4.75 (GAUZE/BANDAGES/DRESSINGS) ×6 IMPLANT
ELECT CAUTERY BLADE 6.4 (BLADE) ×3 IMPLANT
ELECT REM PT RETURN 9FT ADLT (ELECTROSURGICAL) ×3
ELECTRODE REM PT RTRN 9FT ADLT (ELECTROSURGICAL) ×1 IMPLANT
EVACUATOR SILICONE 100CC (DRAIN) ×6 IMPLANT
GAUZE SPONGE 4X4 12PLY STRL (GAUZE/BANDAGES/DRESSINGS) ×3 IMPLANT
GLOVE BIO SURGEON STRL SZ8 (GLOVE) ×3 IMPLANT
GLOVE BIOGEL PI IND STRL 8 (GLOVE) ×1 IMPLANT
GLOVE BIOGEL PI INDICATOR 8 (GLOVE) ×2
GOWN STRL REUS W/ TWL LRG LVL3 (GOWN DISPOSABLE) ×1 IMPLANT
GOWN STRL REUS W/ TWL XL LVL3 (GOWN DISPOSABLE) ×1 IMPLANT
GOWN STRL REUS W/TWL LRG LVL3 (GOWN DISPOSABLE) ×2
GOWN STRL REUS W/TWL XL LVL3 (GOWN DISPOSABLE) ×2
KIT BASIN OR (CUSTOM PROCEDURE TRAY) ×3 IMPLANT
KIT OSTOMY DRAINABLE 2.75 STR (WOUND CARE) ×3 IMPLANT
KIT TURNOVER KIT B (KITS) ×3 IMPLANT
NS IRRIG 1000ML POUR BTL (IV SOLUTION) ×3 IMPLANT
PACK GENERAL/GYN (CUSTOM PROCEDURE TRAY) ×3 IMPLANT
PAD ARMBOARD 7.5X6 YLW CONV (MISCELLANEOUS) ×3 IMPLANT
PENCIL SMOKE EVACUATOR (MISCELLANEOUS) ×3 IMPLANT
SUT ETHILON 2 0 FS 18 (SUTURE) ×6 IMPLANT
SUT PDS AB 1 TP1 96 (SUTURE) ×6 IMPLANT
SUT SILK 2 0 SH (SUTURE) ×3 IMPLANT
SWAB COLLECTION DEVICE MRSA (MISCELLANEOUS) IMPLANT
SWAB CULTURE ESWAB REG 1ML (MISCELLANEOUS) IMPLANT
TOWEL GREEN STERILE (TOWEL DISPOSABLE) ×3 IMPLANT
TOWEL GREEN STERILE FF (TOWEL DISPOSABLE) ×3 IMPLANT

## 2019-03-14 NOTE — Anesthesia Postprocedure Evaluation (Signed)
Anesthesia Post Note  Patient: Harrold Donath  Procedure(s) Performed: REMOVAL OF ABDOMINAL PACKING DEBRIDEMENT CLOSURE/ABDOMINAl (N/A Abdomen) Exploratory Laparotomy (N/A Abdomen)     Patient location during evaluation: ICU Anesthesia Type: General Level of consciousness: patient remains intubated per anesthesia plan Pain management: pain level controlled Vital Signs Assessment: post-procedure vital signs reviewed and stable Respiratory status: patient remains intubated per anesthesia plan Cardiovascular status: stable Postop Assessment: no apparent nausea or vomiting Anesthetic complications: no    Last Vitals:  Vitals:   03/14/19 1200 03/14/19 1300  BP: 101/62 106/68  Pulse: 94 88  Resp: 18 18  Temp: 37.2 C   SpO2: 92% 95%    Last Pain:  Vitals:   03/14/19 1200  TempSrc: Axillary  PainSc:                  Lynda Rainwater

## 2019-03-14 NOTE — Progress Notes (Signed)
Assisted tele visit to patient with family member.  Sheniece Ruggles Anderson, RN   

## 2019-03-14 NOTE — Progress Notes (Signed)
Called to patient's room by RN due to desaturation into the 60's. Upon RT arrival patient was being bagged by RN. Patient was placed back on the ventilator after being bagged and SAT's were 88%. RT increased FIO2 to 50% & suctioned the patient. A moderate amount of think clear / white secretions were obtained. SAT's improved to 95%. Trauma MD was called by RN and a repeat CXR was ordered.

## 2019-03-14 NOTE — Transfer of Care (Signed)
Immediate Anesthesia Transfer of Care Note  Patient: Austin Morales  Procedure(s) Performed: REMOVAL OF ABDOMINAL PACKING DEBRIDEMENT CLOSURE/ABDOMINAl (N/A Abdomen) Exploratory Laparotomy (N/A Abdomen)  Patient Location: ICU  Anesthesia Type:General  Level of Consciousness: Patient remains intubated per anesthesia plan  Airway & Oxygen Therapy: Patient remains intubated per anesthesia plan and Patient placed on Ventilator (see vital sign flow sheet for setting)  Post-op Assessment: Report given to RN and Post -op Vital signs reviewed and stable  Post vital signs: Reviewed and stable  Last Vitals:  Vitals Value Taken Time  BP    Temp    Pulse 98 03/14/19 1144  Resp    SpO2 92 % 03/14/19 1144  Vitals shown include unvalidated device data.  Last Pain:  Vitals:   03/14/19 0800  TempSrc: Axillary  PainSc:          Complications: No apparent anesthesia complications

## 2019-03-14 NOTE — Progress Notes (Signed)
Patient has not had any urine output after returning from OR.  On call trauma MD paged, 500 ml saline bolus order.  RN to continue to monitor.

## 2019-03-14 NOTE — Progress Notes (Signed)
1330:  Patient started desating while RN in room assessing patient.  RT called and RNs started bagging patient.  RT adjust vent settings.  Patient's oxygen saturation back up.  On call Trauma MD notified and new verbal orders received.  RN to continue to monitor.

## 2019-03-14 NOTE — Anesthesia Preprocedure Evaluation (Addendum)
Anesthesia Evaluation  Patient identified by MRN, date of birth, ID band Patient awake    Reviewed: Allergy & Precautions, H&P , NPO status , Patient's Chart, lab work & pertinent test results  Airway Mallampati: Intubated       Dental no notable dental hx. (+) Teeth Intact, Dental Advisory Given   Pulmonary neg pulmonary ROS,    Pulmonary exam normal breath sounds clear to auscultation       Cardiovascular negative cardio ROS   Rhythm:Regular Rate:Tachycardia     Neuro/Psych negative neurological ROS  negative psych ROS   GI/Hepatic negative GI ROS, Neg liver ROS,   Endo/Other  negative endocrine ROS  Renal/GU negative Renal ROS  negative genitourinary   Musculoskeletal   Abdominal   Peds  Hematology negative hematology ROS (+)   Anesthesia Other Findings   Reproductive/Obstetrics negative OB ROS                             Anesthesia Physical  Anesthesia Plan  ASA: III  Anesthesia Plan: General   Post-op Pain Management:    Induction: Intravenous  PONV Risk Score and Plan: 3 and Ondansetron, Dexamethasone, Midazolam and Treatment may vary due to age or medical condition  Airway Management Planned: Oral ETT  Additional Equipment: Arterial line  Intra-op Plan:   Post-operative Plan: Post-operative intubation/ventilation  Informed Consent: I have reviewed the patients History and Physical, chart, labs and discussed the procedure including the risks, benefits and alternatives for the proposed anesthesia with the patient or authorized representative who has indicated his/her understanding and acceptance.     Dental advisory given  Plan Discussed with: CRNA  Anesthesia Plan Comments:        Anesthesia Quick Evaluation
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
## 2019-03-14 NOTE — Anesthesia Preprocedure Evaluation (Deleted)
Anesthesia Evaluation Anesthesia Physical Anesthesia Plan Anesthesia Quick Evaluation  

## 2019-03-15 ENCOUNTER — Inpatient Hospital Stay (HOSPITAL_COMMUNITY): Payer: Medicaid Other

## 2019-03-15 ENCOUNTER — Encounter (HOSPITAL_COMMUNITY): Payer: Self-pay | Admitting: General Surgery

## 2019-03-15 LAB — BASIC METABOLIC PANEL
Anion gap: 10 (ref 5–15)
BUN: 5 mg/dL — ABNORMAL LOW (ref 6–20)
CO2: 19 mmol/L — ABNORMAL LOW (ref 22–32)
Calcium: 7.6 mg/dL — ABNORMAL LOW (ref 8.9–10.3)
Chloride: 107 mmol/L (ref 98–111)
Creatinine, Ser: 1.14 mg/dL (ref 0.61–1.24)
GFR calc Af Amer: >60 mL/min (ref >60)
GFR calc non Af Amer: >60 mL/min (ref >60)
Glucose, Bld: 106 mg/dL — ABNORMAL HIGH (ref 70–99)
Potassium: 3.3 mmol/L — ABNORMAL LOW (ref 3.5–5.1)
Sodium: 136 mmol/L (ref 135–145)

## 2019-03-15 LAB — CBC
HCT: 26.8 % — ABNORMAL LOW (ref 39.0–52.0)
Hemoglobin: 9.1 g/dL — ABNORMAL LOW (ref 13.0–17.0)
MCH: 29.2 pg (ref 26.0–34.0)
MCHC: 34 g/dL (ref 30.0–36.0)
MCV: 85.9 fL (ref 80.0–100.0)
Platelets: 115 10*3/uL — ABNORMAL LOW (ref 150–400)
RBC: 3.12 MIL/uL — ABNORMAL LOW (ref 4.22–5.81)
RDW: 13.3 % (ref 11.5–15.5)
WBC: 5.1 10*3/uL (ref 4.0–10.5)
nRBC: 0 % (ref 0.0–0.2)

## 2019-03-15 LAB — TRIGLYCERIDES: Triglycerides: 241 mg/dL — ABNORMAL HIGH (ref <150)

## 2019-03-15 MED ORDER — ENOXAPARIN SODIUM 40 MG/0.4ML ~~LOC~~ SOLN
40.0000 mg | SUBCUTANEOUS | Status: DC
  Administered 2019-03-15 – 2019-03-20 (×6): 40 mg via SUBCUTANEOUS
  Filled 2019-03-15 (×6): qty 0.4

## 2019-03-15 MED ORDER — LORAZEPAM 2 MG/ML IJ SOLN
1.0000 mg | INTRAMUSCULAR | Status: DC | PRN
  Administered 2019-03-15: 16:00:00 1 mg via INTRAVENOUS
  Administered 2019-03-15: 09:00:00 2 mg via INTRAVENOUS
  Filled 2019-03-15 (×2): qty 1

## 2019-03-15 MED ORDER — FENTANYL CITRATE (PF) 100 MCG/2ML IJ SOLN
50.0000 ug | INTRAMUSCULAR | Status: DC | PRN
  Administered 2019-03-15 – 2019-03-16 (×5): 50 ug via INTRAVENOUS
  Filled 2019-03-15 (×5): qty 2

## 2019-03-15 MED ORDER — METOPROLOL TARTRATE 5 MG/5ML IV SOLN
5.0000 mg | Freq: Four times a day (QID) | INTRAVENOUS | Status: DC | PRN
  Administered 2019-03-15: 5 mg via INTRAVENOUS
  Filled 2019-03-15: qty 5

## 2019-03-15 MED ORDER — METOPROLOL TARTRATE 5 MG/5ML IV SOLN
INTRAVENOUS | Status: AC
  Administered 2019-03-15: 10:00:00 5 mg
  Filled 2019-03-15: qty 5

## 2019-03-15 MED ORDER — OXYCODONE HCL 5 MG PO TABS
5.0000 mg | ORAL_TABLET | ORAL | Status: DC | PRN
  Administered 2019-03-15 – 2019-03-17 (×7): 10 mg via ORAL
  Administered 2019-03-17: 5 mg via ORAL
  Administered 2019-03-18: 21:00:00 10 mg via ORAL
  Filled 2019-03-15 (×7): qty 2
  Filled 2019-03-15: qty 1
  Filled 2019-03-15 (×2): qty 2

## 2019-03-15 MED ORDER — ACETAMINOPHEN 325 MG PO TABS
650.0000 mg | ORAL_TABLET | Freq: Four times a day (QID) | ORAL | Status: DC | PRN
  Administered 2019-03-16: 650 mg via ORAL
  Filled 2019-03-15: qty 2
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
## 2019-03-15 NOTE — Procedures (Signed)
Extubation Procedure Note  Patient Details:   Name: Austin Morales DOB: 2001-01-25 MRN: [id number]   Airway Documentation:  + cuff leak test prior to extubation.  Vent end date: 03/15/19 Vent end time: 0805   Evaluation  O2 sats: stable throughout Complications: No apparent complications Patient did tolerate procedure well. Bilateral Breath Sounds: Diminished   Yes, pt whisper. No distress noted, no stridor noted. MD in room to eval pt pre/post extubation.    Lenna Sciara 03/15/2019, 8:08 AM

## 2019-03-15 NOTE — Progress Notes (Signed)
Assisted tele visit to patient with family member.  Ahmet Schank R, RN  

## 2019-03-15 NOTE — Progress Notes (Signed)
Assisted tele visit to patient with mother.  Tayden Nichelson P, RN   
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
## 2019-03-16 LAB — TYPE AND SCREEN
ABO/RH(D): O POS
Antibody Screen: NEGATIVE
Unit division: 0
Unit division: 0
Unit division: 0
Unit division: 0

## 2019-03-16 LAB — BASIC METABOLIC PANEL
Anion gap: 12 (ref 5–15)
BUN: <5 mg/dL — ABNORMAL LOW (ref 6–20)
CO2: 22 mmol/L (ref 22–32)
Calcium: 8.4 mg/dL — ABNORMAL LOW (ref 8.9–10.3)
Chloride: 101 mmol/L (ref 98–111)
Creatinine, Ser: 0.97 mg/dL (ref 0.61–1.24)
GFR calc Af Amer: >60 mL/min (ref >60)
GFR calc non Af Amer: >60 mL/min (ref >60)
Glucose, Bld: 97 mg/dL (ref 70–99)
Potassium: 3.6 mmol/L (ref 3.5–5.1)
Sodium: 135 mmol/L (ref 135–145)

## 2019-03-16 LAB — BPAM RBC
Blood Product Expiration Date: 202011012359
Blood Product Expiration Date: 202011022359
Blood Product Expiration Date: 202011022359
Blood Product Expiration Date: 202011022359
ISSUE DATE / TIME: 202010061521
ISSUE DATE / TIME: 202010061728
Unit Type and Rh: 5100
Unit Type and Rh: 5100
Unit Type and Rh: 5100
Unit Type and Rh: 5100

## 2019-03-16 LAB — CBC
HCT: 29.3 % — ABNORMAL LOW (ref 39.0–52.0)
Hemoglobin: 10.2 g/dL — ABNORMAL LOW (ref 13.0–17.0)
MCH: 28.7 pg (ref 26.0–34.0)
MCHC: 34.8 g/dL (ref 30.0–36.0)
MCV: 82.5 fL (ref 80.0–100.0)
Platelets: 154 10*3/uL (ref 150–400)
RBC: 3.55 MIL/uL — ABNORMAL LOW (ref 4.22–5.81)
RDW: 12.5 % (ref 11.5–15.5)
WBC: 6.7 10*3/uL (ref 4.0–10.5)
nRBC: 0 % (ref 0.0–0.2)

## 2019-03-16 LAB — NASOPHARYNGEAL CULTURE: Culture: NORMAL

## 2019-03-16 MED ORDER — ADULT MULTIVITAMIN W/MINERALS CH
1.0000 | ORAL_TABLET | Freq: Every day | ORAL | Status: DC
  Administered 2019-03-16 – 2019-03-20 (×5): 1 via ORAL
  Filled 2019-03-16 (×5): qty 1

## 2019-03-16 MED ORDER — METHOCARBAMOL 1000 MG/10ML IJ SOLN
500.0000 mg | Freq: Four times a day (QID) | INTRAVENOUS | Status: DC
  Administered 2019-03-16 – 2019-03-19 (×12): 500 mg via INTRAVENOUS
  Filled 2019-03-16 (×21): qty 5

## 2019-03-16 MED ORDER — IPRATROPIUM-ALBUTEROL 0.5-2.5 (3) MG/3ML IN SOLN
3.0000 mL | RESPIRATORY_TRACT | Status: DC | PRN
  Administered 2019-03-17: 02:00:00 3 mL via RESPIRATORY_TRACT
  Filled 2019-03-16: qty 3

## 2019-03-16 MED ORDER — ACETAMINOPHEN 500 MG PO TABS
1000.0000 mg | ORAL_TABLET | Freq: Three times a day (TID) | ORAL | Status: DC
  Administered 2019-03-16 – 2019-03-20 (×13): 1000 mg via ORAL
  Filled 2019-03-16 (×14): qty 2

## 2019-03-16 MED ORDER — DOCUSATE SODIUM 100 MG PO CAPS
100.0000 mg | ORAL_CAPSULE | Freq: Two times a day (BID) | ORAL | Status: DC
  Administered 2019-03-16 – 2019-03-20 (×8): 100 mg via ORAL
  Filled 2019-03-16 (×9): qty 1

## 2019-03-16 MED ORDER — BOOST / RESOURCE BREEZE PO LIQD CUSTOM
1.0000 | Freq: Three times a day (TID) | ORAL | Status: DC
  Administered 2019-03-16 – 2019-03-18 (×8): 1 via ORAL

## 2019-03-16 MED ORDER — LORAZEPAM 2 MG/ML IJ SOLN: 1.0000 mg | Freq: Four times a day (QID) | INTRAMUSCULAR | Status: DC | PRN

## 2019-03-16 MED ORDER — WHITE PETROLATUM EX OINT
TOPICAL_OINTMENT | CUTANEOUS | Status: AC
  Administered 2019-03-16: 05:00:00
  Filled 2019-03-16: qty 28.35

## 2019-03-16 MED ORDER — IPRATROPIUM-ALBUTEROL 0.5-2.5 (3) MG/3ML IN SOLN
3.0000 mL | RESPIRATORY_TRACT | Status: DC
  Administered 2019-03-16: 16:00:00 3 mL via RESPIRATORY_TRACT
  Filled 2019-03-16 (×2): qty 3

## 2019-03-16 MED ORDER — MORPHINE SULFATE (PF) 2 MG/ML IV SOLN
1.0000 mg | INTRAVENOUS | Status: DC | PRN
  Administered 2019-03-16 – 2019-03-17 (×2): 2 mg via INTRAVENOUS
  Filled 2019-03-16 (×2): qty 1

## 2019-03-16 MED ORDER — IPRATROPIUM-ALBUTEROL 0.5-2.5 (3) MG/3ML IN SOLN: 3.0000 mL | RESPIRATORY_TRACT | Status: DC | PRN

## 2019-03-16 NOTE — Consult Note (Signed)
[place] Nurse ostomy follow up Patient receiving care in [place]. Stoma type/location: LUQ colostomy Stomal assessment/size: approximately 2.5 inches, round, edematous, moist. Peristomal assessment: deferred.  Pouch changed yesterday. No leakage or wash out today Treatment options for stomal/peristomal skin: barrier ring Output: no flatus in pouch, no effluent or fecal content in pouch.  Ostomy pouching: 2pc. Flat 2 3/4 inches Education provided: How to open, close.  Patient was able to open/close existing pouch.  Stated he didn't "want to hear" anything I had to tell him.  I explained that this is his "new way" of pooping, and just like taking himself to the bathroom, pooping, wiping his bottom, washing his hands before the surgery, he must learn how to take care of himself now.   After this conversation, he was able to look at the stoma, and participated in opening/closing the pouch.  I left him with the [facility] education materials and explained that someone on the [place] team would be back next Monday and continue his teaching.  He agreed to look at the printed materials I provided. Enrolled patient in [place] Start Discharge program: No

## 2019-03-16 NOTE — Progress Notes (Signed)
Family notified of transfer to [phone]. Aunt answered phone, who said she will notify his mother when she wakes up.
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
## 2019-03-16 NOTE — Progress Notes (Signed)
Received pt alert and oriented x 4. Pt with mild pain to abdomen. IV site intact. Foley in place and patent. Oriented pt to room.  Dressing to midline changed, colostomy bag changed.

## 2019-03-16 NOTE — Progress Notes (Signed)
175 mLs of fentanyl drip wasted in sharps with Arn Medal, RN

## 2019-03-16 NOTE — Plan of Care (Signed)
  Problem: Education: Goal: Knowledge of General Education information will improve Description: Including pain rating scale, medication(s)/side effects and non-pharmacologic comfort measures Outcome: Progressing   Problem: Clinical Measurements: Goal: Ability to maintain clinical measurements within normal limits will improve Outcome: Progressing   Problem: Activity: Goal: Risk for activity intolerance will decrease Outcome: Progressing   Problem: Nutrition: Goal: Adequate nutrition will be maintained Outcome: Progressing   Problem: Coping: Goal: Level of anxiety will decrease Outcome: Progressing   Problem: Elimination: Goal: Will not experience complications related to bowel motility Outcome: Progressing Goal: Will not experience complications related to urinary retention Outcome: Progressing   Problem: Pain Managment: Goal: General experience of comfort will improve Outcome: Progressing   Problem: Safety: Goal: Ability to remain free from injury will improve Outcome: Progressing   

## 2019-03-16 NOTE — Progress Notes (Signed)
ST elevation noted per telemetry. Pt asymptomatic.
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
## 2019-03-17 ENCOUNTER — Encounter (HOSPITAL_COMMUNITY): Payer: Self-pay

## 2019-03-17 LAB — CBC
HCT: 29.5 % — ABNORMAL LOW (ref 39.0–52.0)
Hemoglobin: 10.6 g/dL — ABNORMAL LOW (ref 13.0–17.0)
MCH: 29.1 pg (ref 26.0–34.0)
MCHC: 35.9 g/dL (ref 30.0–36.0)
MCV: 81 fL (ref 80.0–100.0)
Platelets: 220 10*3/uL (ref 150–400)
RBC: 3.64 MIL/uL — ABNORMAL LOW (ref 4.22–5.81)
RDW: 12.4 % (ref 11.5–15.5)
WBC: 10.8 10*3/uL — ABNORMAL HIGH (ref 4.0–10.5)
nRBC: 0 % (ref 0.0–0.2)

## 2019-03-17 LAB — BASIC METABOLIC PANEL
Anion gap: 12 (ref 5–15)
BUN: 9 mg/dL (ref 6–20)
CO2: 20 mmol/L — ABNORMAL LOW (ref 22–32)
Calcium: 8.4 mg/dL — ABNORMAL LOW (ref 8.9–10.3)
Chloride: 105 mmol/L (ref 98–111)
Creatinine, Ser: 0.88 mg/dL (ref 0.61–1.24)
GFR calc Af Amer: >60 mL/min (ref >60)
GFR calc non Af Amer: >60 mL/min (ref >60)
Glucose, Bld: 111 mg/dL — ABNORMAL HIGH (ref 70–99)
Potassium: 3.5 mmol/L (ref 3.5–5.1)
Sodium: 137 mmol/L (ref 135–145)

## 2019-03-17 LAB — PHOSPHORUS: Phosphorus: 2.5 mg/dL (ref 2.5–4.6)

## 2019-03-17 LAB — MAGNESIUM: Magnesium: 2 mg/dL (ref 1.7–2.4)

## 2019-03-17 MED ORDER — PROMETHAZINE HCL 25 MG/ML IJ SOLN
12.5000 mg | INTRAMUSCULAR | Status: DC | PRN
  Administered 2019-03-17 (×2): 12.5 mg via INTRAVENOUS
  Filled 2019-03-17 (×3): qty 1
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
## 2019-03-17 NOTE — Progress Notes (Signed)
Pt was complaining of abdominal pain , given morphine but after giving morphine pt vomited 100 ml.
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
## 2019-03-17 NOTE — Progress Notes (Signed)
Pt had emesis x2 overnight. Pt blames it to the pain med given. Educated pt.  Bowel sounds hypoactive at this time. No output from colostomy. Encouraged pt more fluid intake.

## 2019-03-18 LAB — CREATININE, FLUID (PLEURAL, PERITONEAL, JP DRAINAGE): Creat, Fluid: 0.8 mg/dL

## 2019-03-18 NOTE — Progress Notes (Signed)
Pt's family and girlfriend calling to speak with the pt. Pt asleep. Updated pt's mom about the pt.
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
## 2019-03-19 ENCOUNTER — Inpatient Hospital Stay (HOSPITAL_COMMUNITY): Payer: Medicaid Other

## 2019-03-19 MED ORDER — METHOCARBAMOL 500 MG PO TABS
500.0000 mg | ORAL_TABLET | Freq: Three times a day (TID) | ORAL | Status: DC
  Administered 2019-03-19 – 2019-03-20 (×6): 500 mg via ORAL
  Filled 2019-03-19 (×6): qty 1

## 2019-03-19 MED ORDER — OXYCODONE HCL 5 MG PO TABS: 5.0000 mg | ORAL_TABLET | Freq: Four times a day (QID) | ORAL | Status: DC | PRN

## 2019-03-19 MED ORDER — IOTHALAMATE MEGLUMINE 17.2 % UR SOLN
250.0000 mL | Freq: Once | URETHRAL | Status: AC | PRN
  Administered 2019-03-19: 250 mL via INTRAVESICAL

## 2019-03-19 MED ORDER — ENSURE ENLIVE PO LIQD
237.0000 mL | Freq: Three times a day (TID) | ORAL | Status: DC
  Administered 2019-03-20 (×2): 237 mL via ORAL

## 2019-03-19 MED ORDER — MORPHINE SULFATE (PF) 2 MG/ML IV SOLN: 1.0000 mg | INTRAVENOUS | Status: DC | PRN

## 2019-03-19 NOTE — Progress Notes (Signed)
PT Cancellation Note  Patient Details Name: Cinsere Mizrahi MRN: [id number] DOB: November 25, 2000   Cancelled Treatment:    Reason Eval/Treat Not Completed: Patient at procedure or test/unavailable   Off the floor at cystogram;   Will follow up later today as time allows;  Otherwise, will follow up for PT tomorrow;   Thank you,  Roney Marion, PT  Acute Rehabilitation Services Pager [phone] Office [phone]     Colletta [place] 03/19/2019, 11:08 AM
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
## 2019-03-19 NOTE — Consult Note (Signed)
[place] Nurse ostomy follow up Stoma type/location: LMQ colostomy Stomal assessment/size:  2 1/4" edematous stoma.  Touched and looked at stoma today. Is learning self care slowly  Does not wish for his mother to be present for care, he will manage independently, although returning to live with his mother.  Peristomal assessment: intact  Midline incision, open Treatment options for stomal/peristomal skin: barrier ring Output soft brown stool Ostomy pouching: 2pc.2 3/4"   Education provided: Pouch change performed and patient applied, cut barrier to fit.  Is able to open/close and verbalize steps to emptying (when up to toilet) Enrolled patient in [place] [facility] Discharge program: Yes today [place] team will follow.  Domenic Moras MSN, RN, FNP-BC CWON Wound, Ostomy, Continence Nurse Pager [phone]

## 2019-03-20 MED ORDER — ADULT MULTIVITAMIN W/MINERALS CH: 1.0000 | ORAL_TABLET | Freq: Every day | ORAL | Status: DC

## 2019-03-20 MED ORDER — OXYCODONE HCL 5 MG PO TABS: 5.0000 mg | ORAL_TABLET | Freq: Four times a day (QID) | ORAL | 0 refills | Status: DC | PRN

## 2019-03-20 MED ORDER — METHOCARBAMOL 500 MG PO TABS: 500.0000 mg | ORAL_TABLET | Freq: Three times a day (TID) | ORAL | 0 refills | Status: DC | PRN

## 2019-03-20 MED ORDER — DOCUSATE SODIUM 100 MG PO CAPS: 100.0000 mg | ORAL_CAPSULE | Freq: Two times a day (BID) | ORAL | 0 refills | Status: DC

## 2019-03-20 MED ORDER — ACETAMINOPHEN 500 MG PO TABS: 1000.0000 mg | ORAL_TABLET | Freq: Three times a day (TID) | ORAL | 0 refills | Status: DC | PRN

## 2019-03-20 MED FILL — oxyCODONE HCL 5 MG TABS: 5 | 5 days supply | Qty: 20 | Fill #0

## 2019-03-20 MED FILL — METHOCARBAMOL 500 MG TABS: 500 | 10 days supply | Qty: 30 | Fill #0

## 2019-03-20 NOTE — Progress Notes (Signed)
Discharged pt to home. Instructions given and explained. 

## 2019-03-20 NOTE — Progress Notes (Signed)
Occupational Therapy Treatment Patient Details Name: Austin Morales MRN: [id number] DOB: August 19, 2000 Today's Date: 03/20/2019    History of present illness Pt is a 18 y/o male admitted secondary multiple GWS. Pt is s/p rigid proctoscopy, FW removal R buttock, SBR, loop sigmoid colostomy, packing preperitoneal space ( 3 packs) by Dr. Dwain Sarna 10/5 - returned to the OR 10/7 for removal of packs and closure dy Dr. Janee Morn, and S/p repair blaffer by Dr. Annabell Howells 10/5. Pt also with a pubis fxt, ortho was consulted and feels no intervention needed at this time. No pertinent PMH.   OT comments  Pt reports fatigue from not sleeping much last night, but agreeable to participate with encouragement. Pt set- up for LB dressing from long sitting. Supervision for sit>stand from EOB with RW. Pt uses RW during functional mobility to assist with managing foley cath but does not need it for balance. Pt ready to have foley cath removed stating he "hates moving around with it." DC plan remains appropriate. Will continue to follow acutely.    Follow Up Recommendations  Home health OT;Supervision/Assistance - 24 hour    Equipment Recommendations  3 in 1 bedside commode    Recommendations for Other Services      Precautions / Restrictions Precautions Precautions: Fall Precaution Comments: abdominal jp drains x2, colostomy Restrictions Weight Bearing Restrictions: No       Mobility Bed Mobility Overal bed mobility: Modified Independent Bed Mobility: Supine to Sit     Supine to sit: Modified independent (Device/Increase time);HOB elevated     General bed mobility comments: no physical assist needed; HOB elevated; use of bed rails  Transfers Overall transfer level: Needs assistance Equipment used: Rolling walker (2 wheeled) Transfers: Sit to/from Stand Sit to Stand: Supervision         General transfer comment: supervision  sit>stand from EOB; pt stands from long sitting d/t pain from foley cath  but does not present to be unsafe    Balance Overall balance assessment: Needs assistance Sitting-balance support: Feet supported Sitting balance-Leahy Scale: Fair Sitting balance - Comments: able to static sit EOB   Standing balance support: During functional activity Standing balance-Leahy Scale: Fair                             ADL either performed or assessed with clinical judgement   ADL                       Lower Body Dressing: Supervision/safety;Set up;Bed level Lower Body Dressing Details (indicate cue type and reason): able to don socks from long sitting in bed Toilet Transfer: Supervision/safety;Ambulation;RW Toilet Transfer Details (indicate cue type and reason): simulated with supervison for safety; pt uses RW to assist with managing foley cath but does not require it for balance/ safety         Functional mobility during ADLs: Supervision/safety;Rolling walker General ADL Comments: supervision LB ADLs; supervision functional mobility with RW     Vision       Perception     Praxis      Cognition   Behavior During Therapy: [facility] for tasks assessed/performed Overall Cognitive Status: Within Functional Limits for tasks assessed                                          Exercises  Shoulder Instructions       General Comments      Pertinent Vitals/ Pain       Pain Assessment: No/denies pain  Home Living                                          Prior Functioning/Environment              Frequency  Min 3X/week        Progress Toward Goals  OT Goals(current goals can now be found in the care plan section)  Progress towards OT goals: Progressing toward goals  Acute Rehab OT Goals Patient Stated Goal: to go back to bed OT Goal Formulation: With patient Time For Goal Achievement: 03/31/19 Potential to Achieve Goals: Good  Plan Discharge plan remains appropriate     Co-evaluation                 AM-PAC OT "6 Clicks" Daily Activity     Outcome Measure   Help from another person eating meals?: None Help from another person taking care of personal grooming?: None Help from another person toileting, which includes using toliet, bedpan, or urinal?: A Little Help from another person bathing (including washing, rinsing, drying)?: A Little Help from another person to put on and taking off regular upper body clothing?: A Little Help from another person to put on and taking off regular lower body clothing?: A Lot 6 Click Score: 19    End of Session Equipment Utilized During Treatment: Rolling walker  OT Visit Diagnosis: Unsteadiness on feet (R26.81);Other abnormalities of gait and mobility (R26.89);Muscle weakness (generalized) (M62.81);Pain   Activity Tolerance Patient tolerated treatment well   Patient Left in bed;with call bell/phone within reach;with nursing/sitter in room   Nurse Communication Other (comment);Mobility status(needs new dressing change)        Time: 2951-8841 OT Time Calculation (min): 12 min  Charges: OT General Charges $OT Visit: 1 Visit OT Treatments $Self Care/Home Management : 8-22 mins  ,[place], [place] [phone] [place] 03/20/2019, 11:46 AM

## 2019-03-20 NOTE — Progress Notes (Signed)
OT Cancellation Note  Patient Details Name: Austin Morales MRN: [id number] DOB: 2000-08-23   Cancelled Treatment:    Reason Eval/Treat Not Completed: Fatigue/lethargy limiting ability to participate;Other (comment) Pt asleep reporting fatigue and declining OT session at this time. Will check back as time allows.   [place], [place] [facility] [place] 03/20/2019, 10:02 AM

## 2019-03-20 NOTE — Progress Notes (Signed)
Patient called this RN and reported that he voided in the bathroom. This RN was not able to measure and see urine because it was flushed. Pt verbalized "It was like the usual urine". Pt reported that it is yellow in color and that patient might have filled a cup.

## 2019-03-20 NOTE — Progress Notes (Signed)
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 Pt is s/p rigid proctoscopy, FW removal R buttock, SBR, loop sigmoid colostomy, packing preperitoneal space ( 3 packs) by Dr. Dwain Sarna 10/5 - returned to the OR 10/7 for removal of packs and closure dy Dr. Janee Morn, and S/p repair blaffer by Dr. Annabell Howells 10/5. Pt also with a pubis fxt, ortho was consulted and feels no intervention needed at this time. No pertinent PMH.    PT Comments    Pt admitted with above diagnosis. Pt was able to ambulate with RW with min guard assist and cues.  Pt progressing well and limited by fatigue today.   Pt currently with functional limitations due to balance and endurance deficits. Pt will benefit from skilled PT to increase their independence and safety with mobility to allow discharge to the venue listed below.     Follow Up Recommendations  Home health PT;Supervision/Assistance - 24 hour     Equipment Recommendations  Other (comment)(TBD)    Recommendations for Other Services       Precautions / Restrictions Precautions Precautions: Fall Precaution Comments: abdominal jp drains x2, colostomy Restrictions Weight Bearing Restrictions: No    Mobility  Bed Mobility Overal bed mobility: Modified Independent Bed Mobility: Supine to Sit     Supine to sit: Modified independent (Device/Increase time);HOB elevated     General bed mobility comments: no physical assist needed; HOB elevated; use of bed rails  Transfers Overall transfer level: Needs assistance Equipment used: Rolling walker (2 wheeled) Transfers: Sit to/from Stand Sit to Stand: Supervision         General transfer comment: supervision  sit>stand from EOB; pt stands from long sitting d/t pain from foley cath but does not present to be unsafe  Ambulation/Gait Ambulation/Gait  assistance: Supervision Gait Distance (Feet): 120 Feet Assistive device: Rolling walker (2 wheeled) Gait Pattern/deviations: Step-through pattern;Decreased stride length   Gait velocity interpretation: >2.62 ft/sec, indicative of community ambulatory General Gait Details: Ambulated wtih overall good safety in hallway.  Does pick up RW at times and carries it and asked pt not to do that.  He only takes RW to hold the foley because he has so much catheter pain.    Stairs             Wheelchair Mobility    Modified Rankin (Stroke Patients Only)       Balance Overall balance assessment: Needs assistance Sitting-balance support: Feet supported;No upper extremity supported Sitting balance-Leahy Scale: Fair Sitting balance - Comments: able to static sit EOB   Standing balance support: During functional activity;No upper extremity supported Standing balance-Leahy Scale: Fair                              Cognition Arousal/Alertness: Awake/alert Behavior During Therapy: WFL for tasks assessed/performed Overall Cognitive Status: Within Functional Limits for tasks assessed                                        Exercises      General Comments        Pertinent Vitals/Pain Pain Assessment: Faces Faces Pain Scale: Hurts little more Pain Location: catheter Pain Descriptors / Indicators: Aching;Constant;Discomfort;Grimacing;Guarding;Sore Pain Intervention(s): Limited activity within patient's  tolerance;Monitored during session;Repositioned    Home Living                      Prior Function            PT Goals (current goals can now be found in the care plan section) Acute Rehab PT Goals Patient Stated Goal: to go back to bed Progress towards PT goals: Progressing toward goals    Frequency    Min 3X/week      PT Plan Current plan remains appropriate    Co-evaluation PT/OT/SLP Co-Evaluation/Treatment: Yes Reason for  Co-Treatment: For patient/therapist safety PT goals addressed during session: Mobility/safety with mobility        AM-PAC PT "6 Clicks" Mobility   Outcome Measure  Help needed turning from your back to your side while in a flat bed without using bedrails?: None Help needed moving from lying on your back to sitting on the side of a flat bed without using bedrails?: None Help needed moving to and from a bed to a chair (including a wheelchair)?: A Little Help needed standing up from a chair using your arms (e.g., wheelchair or bedside chair)?: A Little Help needed to walk in hospital room?: A Little Help needed climbing 3-5 steps with a railing? : A Lot 6 Click Score: 19    End of Session Equipment Utilized During Treatment: Gait belt Activity Tolerance: Patient limited by fatigue Patient left: with call bell/phone within reach;in bed Nurse Communication: Mobility status PT Visit Diagnosis: Other abnormalities of gait and mobility (R26.89);Pain Pain - part of body: (abdomen)     Time: 1937-9024 PT Time Calculation (min) (ACUTE ONLY): 10 min  Charges:  $Gait Training: 8-22 mins                     [place] Pager:  [phone]  Office:  [place] 03/20/2019, 12:53 PM

## 2019-03-20 NOTE — Discharge Instructions (Addendum)
MIDLINE WOUND CARE: - midline dressing to be changed twice daily - supplies: sterile saline, kerlix, scissors, ABD pads, tape  - remove dressing and all packing carefully, moistening with sterile saline as needed to avoid packing/internal dressing sticking to the wound. - clean edges of skin around the wound with water/gauze, making sure there is no tape debris or leakage left on skin that could cause skin irritation or breakdown. - dampen and clean kerlix with sterile saline and pack wound from wound base to skin level, making sure to take note of any possible areas of wound tracking, tunneling and packing appropriately. Wound can be packed loosely. Trim kerlix to size if a whole kerlix is not required. - cover wound with a dry ABD pad and secure with tape.  - write the date/time on the dry dressing/tape to better track when the last dressing change occurred. - apply any skin protectant/powder recommended by clinician to protect skin/skin folds. - change dressing as needed if leakage occurs, wound gets contaminated, or patient requests to shower. - patient may shower daily with wound open and following the shower the wound should be dried and a clean dressing placed.   [facility], [place] [phone]  OPEN ABDOMINAL SURGERY: POST OP INSTRUCTIONS  Always review your discharge instruction sheet given to you by the facility where your surgery was performed.  IF YOU HAVE DISABILITY OR FAMILY LEAVE FORMS, YOU MUST BRING THEM TO THE OFFICE FOR PROCESSING.  PLEASE DO NOT GIVE THEM TO YOUR DOCTOR.  1. A prescription for pain medication may be given to you upon discharge.  Take your pain medication as prescribed, if needed.  If narcotic pain medicine is not needed, then you may take acetaminophen (Tylenol) or ibuprofen (Advil) as needed. 2. Take your usually prescribed medications unless otherwise directed. 3. If you need a refill on your pain medication, please contact your  pharmacy. They will contact our office to request authorization.  Prescriptions will not be filled after 5pm or on week-ends. 4. You should follow a light diet the first few days after arrival home, such as soup and crackers, pudding, etc.unless your doctor has advised otherwise. A high-fiber, low fat diet can be resumed as tolerated.   Be sure to include lots of fluids daily. Most patients will experience some swelling and bruising on the chest and neck area.  Ice packs will help.  Swelling and bruising can take several days to resolve 5. Most patients will experience some swelling and bruising in the area of the incision. Ice pack will help. Swelling and bruising can take several days to resolve..  6. It is common to experience some constipation if taking pain medication after surgery.  Increasing fluid intake and taking a stool softener will usually help or prevent this problem from occurring.  A mild laxative (Milk of Magnesia or Miralax) should be taken according to package directions if there are no bowel movements after 48 hours. 7.  You may have steri-strips (small skin tapes) in place directly over the incision.  These strips should be left on the skin for 7-10 days.  If your surgeon used skin glue on the incision, you may shower in 24 hours.  The glue will flake off over the next 2-3 weeks.  Any sutures or staples will be removed at the office during your follow-up visit. You may find that a light gauze bandage over your incision may keep your staples from being rubbed or pulled.  You may shower and replace the bandage daily. 8. ACTIVITIES:  You may resume regular (light) daily activities beginning the next day--such as daily self-care, walking, climbing stairs--gradually increasing activities as tolerated.  You may have sexual intercourse when it is comfortable.  Refrain from any heavy lifting or straining until approved by your doctor. a. You may drive when you no longer are taking prescription pain  medication, you can comfortably wear a seatbelt, and you can safely maneuver your car and apply brakes b. Return to Work: ___________________________________ 9. You should see your doctor in the office for a follow-up appointment approximately two weeks after your surgery.  Make sure that you call for this appointment within a day or two after you arrive home to insure a convenient appointment time. OTHER INSTRUCTIONS:  _____________________________________________________________ _____________________________________________________________  WHEN TO CALL YOUR DOCTOR: 1. Fever over 101.0 2. Inability to urinate 3. Nausea and/or vomiting 4. Extreme swelling or bruising 5. Continued bleeding from incision. 6. Increased pain, redness, or drainage from the incision. 7. Difficulty swallowing or breathing 8. Muscle cramping or spasms. 9. Numbness or tingling in hands or feet or around lips.  The clinic staff is available to answer your questions during regular business hours.  Please dont hesitate to call and ask to speak to one of the nurses if you have concerns.  For further questions, please visit [email or web address]    Colostomy Home Guide, Adult  Colostomy surgery is done to create an opening in the front of the abdomen for stool (feces) to leave the body through an ostomy (stoma). Part of the large intestine is attached to the stoma. A bag, also called a pouch, is fitted over the stoma. Stool and gas will collect in the bag. After surgery, you will need to empty and change your colostomy bag as needed. You will also need to care for your stoma. How to care for the stoma Your stoma should look pink, red, and moist, like the inside of your cheek. Soon after surgery, the stoma may be swollen, but this swelling will go away within 6 weeks. To care for the stoma:  Keep the skin around the stoma clean and dry.  Use a clean, soft washcloth to gently wash the stoma and the skin  around it. Clean using a circular motion, and wipe away from the stoma opening, not toward it. ? Use warm water and only use cleansers recommended by your health care provider. ? Rinse the stoma area with plain water. ? Dry the area around the stoma well.  Use stoma powder or ointment on your skin only as told by your health care provider. Do not use any other powders, gels, wipes, or creams on the skin around the stoma.  Check the stoma area every day for signs of infection. Check for: ? New or worsening redness, swelling, or pain. ? New or increased fluid or blood. ? Pus or warmth.  Measure the stoma opening regularly and record the size. Watch for changes. (It is normal for the stoma to get smaller as swelling goes away.) Share this information with your health care provider. How to empty the colostomy bag  Empty your bag at bedtime and whenever it is one-third to one-half full. Do not let the bag get more than half-full with stool or gas. The bag could leak if it gets too full. Some colostomy bags have a built-in gas release valve that releases gas often throughout the day. Follow these basic steps: 1. Wash your hands  with soap and water. 2. Sit far back on the toilet seat. 3. Put several pieces of toilet paper into the toilet water. This will prevent splashing as you empty stool into the toilet. 4. Remove the clip or the hook-and-loop fastener from the tail end of the bag. 5. Unroll the tail, then empty the stool into the toilet. 6. Clean the tail with toilet paper or a moist towelette. 7. Reroll the tail, and close it with the clip or the hook-and-loop fastener. 8. Wash your hands again. How to change the colostomy bag Change your bag every 3-4 days or as often as told by your health care provider. Also change the bag if it is leaking or separating from the skin, or if your skin around the stoma looks or feels irritated. Irritated skin may be a sign that the bag is leaking. Always  have colostomy supplies with you, and follow these basic steps: 1. Wash your hands with soap and water. Have paper towels or tissues nearby to clean any discharge. 2. Remove the old bag and skin barrier. Use your fingers or a warm cloth to gently push the skin away from the barrier. 3. Clean the stoma area with water or with mild soap and water, as directed. Use water to rinse away any soap. 4. Dry the skin. You may use the cool setting on a hair dryer to do this. 5. Use a tracing pattern (template) to cut the skin barrier to the size needed. 6. If you are using a two-piece bag, attach the bag and the skin barrier to each other. Add the barrier ring, if you use one. 7. If directed, apply stoma powder or skin barrier gel to the skin. 8. Warm the skin barrier with your hands, or blow with a hair dryer for 5-10 seconds. 9. Remove the paper from the adhesive strip of the skin barrier. 10. Press the adhesive strip onto the skin around the stoma. 11. Gently rub the skin barrier onto the skin. This creates heat that helps the barrier to stick. 12. Apply stoma tape to the edges of the skin barrier, if desired. 40. Wash your hands again. General recommendations  Avoid wearing tight clothes or having anything press directly on your stoma or bag. Change your clothing whenever it is soiled or damp.  You may shower or bathe with the bag on or off. Do not use harsh or oily soaps or lotions. Dry the skin and bag after bathing.  Store all supplies in a cool, dry place. Do not leave supplies in extreme heat because some parts can melt or not stick as well.  Whenever you leave home, take extra clothing and an extra skin barrier and bag with you.  If your bag gets wet, you can dry it with a hair dryer on the cool setting.  To prevent odor, you may put drops of ostomy deodorizer in the bag.  If recommended by your health care provider, put ostomy lubricant inside the bag. This helps stool to slide out of the  bag more easily and completely. Contact a health care provider if:  You have new or worsening redness, swelling, or pain around your stoma.  You have new or increased fluid or blood coming from your stoma.  Your stoma feels warm to the touch.  You have pus coming from your stoma.  Your stoma extends in or out farther than normal.  You need to change your bag every day.  You have a fever. Get  help right away if:  Your stool is bloody.  You have nausea or you vomit.  You have trouble breathing. Summary  Measure your stoma opening regularly and record the size. Watch for changes.  Empty your bag at bedtime and whenever it is one-third to one-half full. Do not let the bag get more than half-full with stool or gas.  Change your bag every 3-4 days or as often as told by your health care provider.  Whenever you leave home, take extra clothing and an extra skin barrier and bag with you. This information is not intended to replace advice given to you by your health care provider. Make sure you discuss any questions you have with your health care provider. Document Released: 05/27/2003 Document Revised: 09/13/2018 Document Reviewed: 11/17/2016 Elsevier Patient Education  2020 [employer]    [facility] Surgical drains are used to remove extra fluid that normally builds up in a surgical wound after surgery. A surgical drain helps to heal a surgical wound. Different kinds of surgical drains include:  Active drains. These drains use suction to pull drainage away from the surgical wound. Drainage flows through a tube to a container outside of the body. With these drains, you need to keep the bulb or the drainage container flat (compressed) at all times, except while you empty it. Flattening the bulb or container creates suction.  Passive drains. These drains allow fluid to drain naturally, by gravity. Drainage flows through a tube to a bandage (dressing) or a container  outside of the body. Passive drains do not need to be emptied. A drain is placed during surgery. Right after surgery, drainage is usually bright red and a little thicker than water. The drainage may gradually turn yellow or pink and become thinner. It is likely that your health care provider will remove the drain when the drainage stops or when the amount decreases to 1-2 Tbsp (15-30 mL) during a 24-hour period. Supplies needed:  Tape.  Germ-free cleaning solution (sterile saline).  Cotton swabs.  Split gauze drain sponge: 4 x 4 inches (10 x 10 cm).  Gauze square: 4 x 4 inches (10 x 10 cm). How to care for your surgical drain Care for your drain as told by your health care provider. This is important to help prevent infection. If your drain is placed at your back, or any other hard-to-reach area, ask another person to assist you in performing the following tasks: General care  Keep the skin around the drain dry and covered with a dressing at all times.  Check your drain area every day for signs of infection. Check for: ? Redness, swelling, or pain. ? Pus or a bad smell. ? Cloudy drainage. ? Tenderness or pressure at the drain exit site. Changing the dressing Follow instructions from your health care provider about how to change your dressing. Change your dressing at least once a day. Change it more often if needed to keep the dressing dry. Make sure you: 1. Gather your supplies. 2. Wash your hands with soap and water before you change your dressing. If soap and water are not available, use hand sanitizer. 3. Remove the old dressing. Avoid using scissors to do that. 4. Wash your hands with soap and water again after removing the old dressing. 5. Use sterile saline to clean your skin around the drain. You may need to use a cotton swab to clean the skin. 6. Place the tube through the slit in a drain sponge.  Place the drain sponge so that it covers your wound. 7. Place the gauze square or  another drain sponge on top of the drain sponge that is on the wound. Make sure the tube is between those layers. 8. Tape the dressing to your skin. 9. Tape the drainage tube to your skin 1-2 inches (2.5-5 cm) below the place where the tube enters your body. Taping keeps the tube from pulling on any stitches (sutures) that you have. 10. Wash your hands with soap and water. 11. Write down the color of your drainage and how often you change your dressing. How to empty your active drain  1. Make sure that you have a measuring cup that you can empty your drainage into. 2. Wash your hands with soap and water. If soap and water are not available, use hand sanitizer. 3. Loosen any pins or clips that hold the tube in place. 4. If your health care provider tells you to strip the tube to prevent clots and tube blockages: ? Hold the tube at the skin with one hand. Use your other hand to pinch the tubing with your thumb and first finger. ? Gently move your fingers down the tube while squeezing very lightly. This clears any drainage, clots, or tissue from the tube. ? You may need to do this several times each day to keep the tube clear. Do not pull on the tube. 5. Open the bulb cap or the drain plug. Do not touch the inside of the cap or the bottom of the plug. 6. Turn the device upside down and gently squeeze. 7. Empty all of the drainage into the measuring cup. 8. Compress the bulb or the container and replace the cap or the plug. To compress the bulb or the container, squeeze it firmly in the middle while you close the cap or plug the container. 9. Write down the amount of drainage that you have in each 24-hour period. If you have less than 2 Tbsp (30 mL) of drainage during 24 hours, contact your health care provider. 10. Flush the drainage down the toilet. 11. Wash your hands with soap and water. Contact a health care provider if:  You have redness, swelling, or pain around your drain area.  You have  pus or a bad smell coming from your drain area.  You have a fever or chills.  The skin around your drain is warm to the touch.  The amount of drainage that you have is increasing instead of decreasing.  You have drainage that is cloudy.  There is a sudden stop or a sudden decrease in the amount of drainage that you have.  Your drain tube falls out.  Your active drain does not stay compressed after you empty it. Summary  Surgical drains are used to remove extra fluid that normally builds up in a surgical wound after surgery.  Different kinds of surgical drains include active drains and passive drains. Active drains use suction to pull drainage away from the surgical wound, and passive drains allow fluid to drain naturally.  It is important to care for your drain to prevent infection. If your drain is placed at your back, or any other hard-to-reach area, ask another person to assist you.  Contact your health care provider if you have redness, swelling, or pain around your drain area. This information is not intended to replace advice given to you by your health care provider. Make sure you discuss any questions you have with your health  care provider. Document Released: 05/21/2000 Document Revised: 06/28/2018 Document Reviewed: 06/28/2018 Elsevier Patient Education  2020 [employer].
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
## 2019-03-20 NOTE — Progress Notes (Signed)
Pt discontinued foley cath at 11:25 and need to record output and post void residual at first void , instructed and understood, change abdomen dressing wet to dry and sacral dressing too.
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
## 2019-03-20 NOTE — Progress Notes (Signed)
Pt has no urine output at this time, endorsed to next nurse.
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
## 2019-03-30 ENCOUNTER — Other Ambulatory Visit (HOSPITAL_COMMUNITY): Payer: Self-pay | Admitting: Surgery

## 2019-03-30 DIAGNOSIS — S3720XA Unspecified injury of bladder, initial encounter: Secondary | ICD-10-CM

## 2019-04-05 ENCOUNTER — Ambulatory Visit (HOSPITAL_COMMUNITY)
Admission: RE | Admit: 2019-04-05 | Discharge: 2019-04-05 | Disposition: A | Discharge status: Home / Self Care | Payer: Medicaid Other | Source: Ambulatory Visit | Attending: Surgery | Admitting: Surgery

## 2019-04-05 ENCOUNTER — Other Ambulatory Visit: Payer: Self-pay

## 2019-04-05 DIAGNOSIS — S3720XA Unspecified injury of bladder, initial encounter: Secondary | ICD-10-CM | POA: Diagnosis not present

## 2019-04-05 MED ORDER — IOTHALAMATE MEGLUMINE 17.2 % UR SOLN
60.0000 mL | Freq: Once | URETHRAL | Status: AC | PRN
  Administered 2019-04-05: 60 mL via INTRAVESICAL

## 2019-04-19 ENCOUNTER — Other Ambulatory Visit: Payer: Self-pay | Admitting: Urology

## 2019-04-27 ENCOUNTER — Other Ambulatory Visit (HOSPITAL_COMMUNITY)
Admission: RE | Admit: 2019-04-27 | Discharge: 2019-04-27 | Disposition: A | Discharge status: Home / Self Care | Payer: Medicaid Other | Source: Ambulatory Visit | Attending: Urology | Admitting: Urology

## 2019-04-27 DIAGNOSIS — Z20828 Contact with and (suspected) exposure to other viral communicable diseases: Secondary | ICD-10-CM | POA: Insufficient documentation

## 2019-04-27 DIAGNOSIS — Z01812 Encounter for preprocedural laboratory examination: Secondary | ICD-10-CM | POA: Insufficient documentation

## 2019-04-29 LAB — NOVEL CORONAVIRUS, NAA (HOSP ORDER, SEND-OUT TO REF LAB; TAT 18-24 HRS): SARS-CoV-2, NAA: NOT DETECTED

## 2019-04-30 ENCOUNTER — Encounter (HOSPITAL_BASED_OUTPATIENT_CLINIC_OR_DEPARTMENT_OTHER): Payer: Self-pay

## 2019-04-30 ENCOUNTER — Other Ambulatory Visit: Payer: Self-pay

## 2019-04-30 NOTE — Progress Notes (Signed)
Spoke with: Austin Morales NPO:  After Midnight, no gum, candy, or mints   Arrival time: 0645 AM Labs: N/A AM medications: NONE Pre op orders: Yes Ride home: Austin Morales (mom) [phone]
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
## 2019-04-30 NOTE — Progress Notes (Signed)
SPOKE W/  [name]     SCREENING SYMPTOMS OF COVID 19:   COUGH--NO  RUNNY NOSE--- NO  SORE THROAT---NO  NASAL CONGESTION----NO  SNEEZING----NO  SHORTNESS OF BREATH---NO  DIFFICULTY BREATHING---NO  TEMP >100.0 -----NO  UNEXPLAINED BODY ACHES------NO  CHILLS -------- NO  HEADACHES ---------NO  LOSS OF SMELL/ TASTE --------NO    HAVE YOU OR ANY FAMILY MEMBER TRAVELLED PAST 14 DAYS OUT OF THE   COUNTY---Lives in [place] STATE----NO COUNTRY----NO  HAVE YOU OR ANY FAMILY MEMBER BEEN EXPOSED TO ANYONE WITH COVID 19? NO

## 2019-05-01 ENCOUNTER — Ambulatory Visit (HOSPITAL_BASED_OUTPATIENT_CLINIC_OR_DEPARTMENT_OTHER): Payer: Medicaid Other | Admitting: Certified Registered"

## 2019-05-01 ENCOUNTER — Encounter (HOSPITAL_BASED_OUTPATIENT_CLINIC_OR_DEPARTMENT_OTHER): Payer: Self-pay

## 2019-05-01 ENCOUNTER — Ambulatory Visit (HOSPITAL_BASED_OUTPATIENT_CLINIC_OR_DEPARTMENT_OTHER)
Admission: RE | Admit: 2019-05-01 | Discharge: 2019-05-01 | Disposition: A | Discharge status: Home / Self Care | Payer: Medicaid Other | Attending: Urology | Admitting: Urology

## 2019-05-01 ENCOUNTER — Encounter (HOSPITAL_BASED_OUTPATIENT_CLINIC_OR_DEPARTMENT_OTHER)
Admission: RE | Disposition: A | Discharge status: Home / Self Care | Payer: Self-pay | Source: Home / Self Care | Attending: Urology

## 2019-05-01 DIAGNOSIS — N390 Urinary tract infection, site not specified: Secondary | ICD-10-CM | POA: Diagnosis not present

## 2019-05-01 DIAGNOSIS — W3400XS Accidental discharge from unspecified firearms or gun, sequela: Secondary | ICD-10-CM | POA: Insufficient documentation

## 2019-05-01 DIAGNOSIS — R350 Frequency of micturition: Secondary | ICD-10-CM | POA: Diagnosis not present

## 2019-05-01 DIAGNOSIS — R519 Headache, unspecified: Secondary | ICD-10-CM | POA: Diagnosis not present

## 2019-05-01 DIAGNOSIS — Z933 Colostomy status: Secondary | ICD-10-CM | POA: Diagnosis not present

## 2019-05-01 DIAGNOSIS — R3915 Urgency of urination: Secondary | ICD-10-CM | POA: Diagnosis not present

## 2019-05-01 DIAGNOSIS — R3912 Poor urinary stream: Secondary | ICD-10-CM | POA: Diagnosis not present

## 2019-05-01 DIAGNOSIS — Z79899 Other long term (current) drug therapy: Secondary | ICD-10-CM | POA: Diagnosis not present

## 2019-05-01 DIAGNOSIS — N35919 Unspecified urethral stricture, male, unspecified site: Secondary | ICD-10-CM | POA: Insufficient documentation

## 2019-05-01 HISTORY — PX: CYSTOSCOPY WITH URETHRAL DILATATION: SHX5125

## 2019-05-01 HISTORY — DX: Migraine, unspecified, not intractable, without status migrainosus: G43.909

## 2019-05-01 HISTORY — DX: Unspecified urethral stricture, male, unspecified site: N35.919

## 2019-05-01 SURGERY — CYSTOSCOPY, WITH URETHRAL DILATION
Anesthesia: General | Site: Urethra

## 2019-05-01 MED ORDER — MIDAZOLAM HCL 5 MG/5ML IJ SOLN
INTRAMUSCULAR | Status: DC | PRN
  Administered 2019-05-01: 2 mg via INTRAVENOUS

## 2019-05-01 MED ORDER — SODIUM CHLORIDE 0.9 % IR SOLN
Status: DC | PRN
  Administered 2019-05-01: 1 via INTRAVESICAL

## 2019-05-01 MED ORDER — FENTANYL CITRATE (PF) 100 MCG/2ML IJ SOLN
25.0000 ug | INTRAMUSCULAR | Status: DC | PRN
  Filled 2019-05-01: qty 1

## 2019-05-01 MED ORDER — LIDOCAINE 2% (20 MG/ML) 5 ML SYRINGE
INTRAMUSCULAR | Status: AC
  Filled 2019-05-01: qty 5

## 2019-05-01 MED ORDER — SODIUM CHLORIDE 0.9% FLUSH
3.0000 mL | INTRAVENOUS | Status: DC | PRN
  Filled 2019-05-01: qty 3

## 2019-05-01 MED ORDER — OXYCODONE HCL 5 MG PO TABS
5.0000 mg | ORAL_TABLET | ORAL | Status: DC | PRN
  Filled 2019-05-01: qty 2

## 2019-05-01 MED ORDER — IOHEXOL 300 MG/ML  SOLN
INTRAMUSCULAR | Status: DC | PRN
  Administered 2019-05-01: 40 mL

## 2019-05-01 MED ORDER — ACETAMINOPHEN 500 MG PO TABS
1000.0000 mg | ORAL_TABLET | Freq: Once | ORAL | Status: AC
  Administered 2019-05-01: 1000 mg via ORAL
  Filled 2019-05-01: qty 2

## 2019-05-01 MED ORDER — PROPOFOL 10 MG/ML IV BOLUS
INTRAVENOUS | Status: DC | PRN
  Administered 2019-05-01: 180 mg via INTRAVENOUS

## 2019-05-01 MED ORDER — PROPOFOL 10 MG/ML IV BOLUS
INTRAVENOUS | Status: AC
  Filled 2019-05-01: qty 20

## 2019-05-01 MED ORDER — PROMETHAZINE HCL 25 MG/ML IJ SOLN
6.2500 mg | INTRAMUSCULAR | Status: DC | PRN
  Filled 2019-05-01: qty 1

## 2019-05-01 MED ORDER — FENTANYL CITRATE (PF) 100 MCG/2ML IJ SOLN
INTRAMUSCULAR | Status: AC
  Filled 2019-05-01: qty 2

## 2019-05-01 MED ORDER — SODIUM CHLORIDE 0.9% FLUSH
3.0000 mL | Freq: Two times a day (BID) | INTRAVENOUS | Status: DC
  Filled 2019-05-01: qty 3

## 2019-05-01 MED ORDER — SODIUM CHLORIDE 0.9 % IV SOLN
250.0000 mL | INTRAVENOUS | Status: DC | PRN
  Filled 2019-05-01: qty 250

## 2019-05-01 MED ORDER — CEPHALEXIN 500 MG PO CAPS: 500.0000 mg | ORAL_CAPSULE | Freq: Four times a day (QID) | ORAL | 3 refills | Status: AC

## 2019-05-01 MED ORDER — PHENYLEPHRINE 40 MCG/ML (10ML) SYRINGE FOR IV PUSH (FOR BLOOD PRESSURE SUPPORT)
PREFILLED_SYRINGE | INTRAVENOUS | Status: AC
  Filled 2019-05-01: qty 10

## 2019-05-01 MED ORDER — DEXAMETHASONE SODIUM PHOSPHATE 10 MG/ML IJ SOLN
INTRAMUSCULAR | Status: DC | PRN
  Administered 2019-05-01: 10 mg via INTRAVENOUS

## 2019-05-01 MED ORDER — DEXAMETHASONE SODIUM PHOSPHATE 10 MG/ML IJ SOLN
INTRAMUSCULAR | Status: AC
  Filled 2019-05-01: qty 1

## 2019-05-01 MED ORDER — CEFAZOLIN SODIUM-DEXTROSE 2-4 GM/100ML-% IV SOLN
INTRAVENOUS | Status: AC
  Filled 2019-05-01: qty 100

## 2019-05-01 MED ORDER — FENTANYL CITRATE (PF) 100 MCG/2ML IJ SOLN
INTRAMUSCULAR | Status: DC | PRN
  Administered 2019-05-01 (×2): 50 ug via INTRAVENOUS

## 2019-05-01 MED ORDER — ONDANSETRON HCL 4 MG/2ML IJ SOLN
INTRAMUSCULAR | Status: AC
  Filled 2019-05-01: qty 2

## 2019-05-01 MED ORDER — MORPHINE SULFATE (PF) 2 MG/ML IV SOLN
2.0000 mg | INTRAVENOUS | Status: DC | PRN
  Filled 2019-05-01: qty 1

## 2019-05-01 MED ORDER — ONDANSETRON HCL 4 MG/2ML IJ SOLN
INTRAMUSCULAR | Status: DC | PRN
  Administered 2019-05-01: 4 mg via INTRAVENOUS

## 2019-05-01 MED ORDER — ACETAMINOPHEN 500 MG PO TABS
ORAL_TABLET | ORAL | Status: AC
  Filled 2019-05-01: qty 2

## 2019-05-01 MED ORDER — ACETAMINOPHEN 325 MG PO TABS
650.0000 mg | ORAL_TABLET | ORAL | Status: DC | PRN
  Filled 2019-05-01: qty 2

## 2019-05-01 MED ORDER — LIDOCAINE 2% (20 MG/ML) 5 ML SYRINGE
INTRAMUSCULAR | Status: DC | PRN
  Administered 2019-05-01: 100 mg via INTRAVENOUS

## 2019-05-01 MED ORDER — LACTATED RINGERS IV SOLN
INTRAVENOUS | Status: DC
  Administered 2019-05-01: 07:00:00 via INTRAVENOUS
  Filled 2019-05-01: qty 1000

## 2019-05-01 MED ORDER — ACETAMINOPHEN 650 MG RE SUPP
650.0000 mg | RECTAL | Status: DC | PRN
  Filled 2019-05-01: qty 1

## 2019-05-01 MED ORDER — MIDAZOLAM HCL 2 MG/2ML IJ SOLN
INTRAMUSCULAR | Status: AC
  Filled 2019-05-01: qty 2

## 2019-05-01 MED ORDER — CEFAZOLIN SODIUM-DEXTROSE 2-4 GM/100ML-% IV SOLN
2.0000 g | INTRAVENOUS | Status: AC
  Administered 2019-05-01: 2 g via INTRAVENOUS
  Filled 2019-05-01: qty 100

## 2019-05-01 SURGICAL SUPPLY — 23 items
BAG DRAIN URO-CYSTO SKYTR STRL (DRAIN) ×3 IMPLANT
BALLN NEPHROSTOMY (BALLOONS) ×3
BALLOON NEPHROSTOMY (BALLOONS) ×1 IMPLANT
CATH FOLEY 2WAY SLVR  5CC 16FR (CATHETERS) ×2
CATH FOLEY 2WAY SLVR 5CC 16FR (CATHETERS) ×1 IMPLANT
CLOTH BEACON ORANGE TIMEOUT ST (SAFETY) ×6 IMPLANT
ELECT REM PT RETURN 9FT ADLT (ELECTROSURGICAL) ×3
ELECTRODE REM PT RTRN 9FT ADLT (ELECTROSURGICAL) ×1 IMPLANT
GLOVE SURG SS PI 8.0 STRL IVOR (GLOVE) ×3 IMPLANT
GOWN STRL REUS W/TWL XL LVL3 (GOWN DISPOSABLE) ×3 IMPLANT
GUIDEWIRE STR DUAL SENSOR (WIRE) IMPLANT
KIT TURNOVER CYSTO (KITS) ×3 IMPLANT
MANIFOLD NEPTUNE II (INSTRUMENTS) IMPLANT
NDL SAFETY ECLIPSE 18X1.5 (NEEDLE) IMPLANT
NEEDLE HYPO 18GX1.5 SHARP (NEEDLE)
NEEDLE HYPO 22GX1.5 SAFETY (NEEDLE) IMPLANT
NS IRRIG 500ML POUR BTL (IV SOLUTION) ×3 IMPLANT
PACK CYSTO (CUSTOM PROCEDURE TRAY) ×3 IMPLANT
SYR 20ML LL LF (SYRINGE) IMPLANT
SYR 30ML LL (SYRINGE) IMPLANT
TUBE CONNECTING 12'X1/4 (SUCTIONS)
TUBE CONNECTING 12X1/4 (SUCTIONS) IMPLANT
WATER STERILE IRR 3000ML UROMA (IV SOLUTION) ×3 IMPLANT

## 2019-05-01 NOTE — Discharge Instructions (Addendum)
CYSTOSCOPY HOME CARE INSTRUCTIONS  Activity: Rest for the remainder of the day.  Do not drive or operate equipment today.  You may resume normal activities in one to two days as instructed by your physician.   Meals: Drink plenty of liquids and eat light foods such as gelatin or soup this evening.  You may return to a normal meal plan tomorrow.  Return to Work: You may return to work in one to two days or as instructed by your physician.  Special Instructions / Symptoms: Call your physician if any of these symptoms occur:   -persistent or heavy bleeding  -bleeding which continues after first few urination  -large blood clots that are difficult to pass  -urine stream diminishes or stops completely  -fever equal to or higher than 101 degrees Farenheit.  -cloudy urine with a strong, foul odor  -severe pain  Females should always wipe from front to back after elimination.  You may feel some burning pain when you urinate.  This should disappear with time.  Applying moist heat to the lower abdomen or a hot tub bath may help relieve the pain.   I have sent a prescription for keflex to the pharmacy.   You need to take it 4 times a daily for a week and then nightly.   The prescription is for 30 tablets and 3 refills.      Post Anesthesia Home Care Instructions  Activity: Get plenty of rest for the remainder of the day. A responsible individual must stay with you for 24 hours following the procedure.  For the next 24 hours, DO NOT: -Drive a car -Paediatric nurse -Drink alcoholic beverages -Take any medication unless instructed by your physician -Make any legal decisions or sign important papers.  Meals: Start with liquid foods such as gelatin or soup. Progress to regular foods as tolerated. Avoid greasy, spicy, heavy foods. If nausea and/or vomiting occur, drink only clear liquids until the nausea and/or vomiting subsides. Call your physician if vomiting continues.  Special  Instructions/Symptoms: Your throat may feel dry or sore from the anesthesia or the breathing tube placed in your throat during surgery. If this causes discomfort, gargle with warm salt water. The discomfort should disappear within 24 hours.
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
## 2019-05-01 NOTE — Interval H&P Note (Signed)
No change in history

## 2019-05-01 NOTE — Transfer of Care (Signed)
Immediate Anesthesia Transfer of Care Note  Patient: Austin Morales  Procedure(s) Performed: CYSTOSCOPY WITH URETHRAL DILATATION AND REMOVAL OF FOREIGN BODY (N/A Urethra)  Patient Location: PACU  Anesthesia Type:General  Level of Consciousness: awake, alert  and oriented  Airway & Oxygen Therapy: Patient Spontanous Breathing and Patient connected to nasal cannula oxygen  Post-op Assessment: Report given to RN and Post -op Vital signs reviewed and stable  Post vital signs: Reviewed and stable  Last Vitals:  Vitals Value Taken Time  BP    Temp    Pulse 77 05/01/19 0932  Resp    SpO2 100 % 05/01/19 0932  Vitals shown include unvalidated device data.  Last Pain:  Vitals:   05/01/19 0715  TempSrc: Oral  PainSc: 0-No pain      Patients Stated Pain Goal: 6 (16/10/96 0454)  Complications: No apparent anesthesia complications

## 2019-05-01 NOTE — Anesthesia Preprocedure Evaluation (Signed)
Anesthesia Evaluation  Patient identified by MRN, date of birth, ID band Patient awake    Reviewed: Allergy & Precautions, NPO status , Patient's Chart, lab work & pertinent test results  Airway Mallampati: II  TM Distance: >3 FB Neck ROM: Full    Dental  (+) Dental Advisory Given   Pulmonary neg pulmonary ROS,    breath sounds clear to auscultation       Cardiovascular negative cardio ROS   Rhythm:Regular Rate:Normal     Neuro/Psych  Headaches,    GI/Hepatic negative GI ROS, (+)     substance abuse  marijuana use,   Endo/Other  negative endocrine ROS  Renal/GU negative Renal ROS     Musculoskeletal   Abdominal   Peds  Hematology negative hematology ROS (+)   Anesthesia Other Findings   Reproductive/Obstetrics                             Anesthesia Physical Anesthesia Plan  ASA: I  Anesthesia Plan: General   Post-op Pain Management:    Induction: Intravenous  PONV Risk Score and Plan: 2 and Dexamethasone, Ondansetron and Treatment may vary due to age or medical condition  Airway Management Planned: LMA  Additional Equipment:   Intra-op Plan:   Post-operative Plan: Extubation in OR  Informed Consent: I have reviewed the patients History and Physical, chart, labs and discussed the procedure including the risks, benefits and alternatives for the proposed anesthesia with the patient or authorized representative who has indicated his/her understanding and acceptance.     Dental advisory given  Plan Discussed with: CRNA  Anesthesia Plan Comments:         Anesthesia Quick Evaluation

## 2019-05-01 NOTE — Anesthesia Procedure Notes (Signed)
Procedure Name: LMA Insertion Date/Time: 05/01/2019 9:05 AM Performed by: Sameera Betton D, CRNA Pre-anesthesia Checklist: Patient identified, Emergency Drugs available, Suction available and Patient being monitored Patient Re-evaluated:Patient Re-evaluated prior to induction Oxygen Delivery Method: Circle system utilized Preoxygenation: Pre-oxygenation with 100% oxygen Induction Type: IV induction Ventilation: Mask ventilation without difficulty LMA: LMA inserted LMA Size: 4.0 Tube type: Oral Number of attempts: 1 Placement Confirmation: positive ETCO2 and breath sounds checked- equal and bilateral Tube secured with: Tape Dental Injury: Teeth and Oropharynx as per pre-operative assessment

## 2019-05-02 ENCOUNTER — Encounter (HOSPITAL_BASED_OUTPATIENT_CLINIC_OR_DEPARTMENT_OTHER): Payer: Self-pay | Admitting: Urology

## 2019-05-02 NOTE — Anesthesia Postprocedure Evaluation (Signed)
Anesthesia Post Note  Patient: Austin Morales  Procedure(s) Performed: CYSTOSCOPY WITH URETHRAL DILATATION AND REMOVAL OF FOREIGN BODY (N/A Urethra)     Patient location during evaluation: PACU Anesthesia Type: General Level of consciousness: awake and alert Pain management: pain level controlled Vital Signs Assessment: post-procedure vital signs reviewed and stable Respiratory status: spontaneous breathing, nonlabored ventilation, respiratory function stable and patient connected to nasal cannula oxygen Cardiovascular status: blood pressure returned to baseline and stable Postop Assessment: no apparent nausea or vomiting Anesthetic complications: no    Last Vitals:  Vitals:   05/01/19 1045 05/01/19 1145  BP: 101/64 126/78  Pulse: 72 (!) 58  Resp: 14 14  Temp:  36.6 C  SpO2: 98% 100%    Last Pain:  Vitals:   05/01/19 1145  TempSrc:   PainSc: 0-No pain                 Tiajuana Amass

## 2019-06-19 ENCOUNTER — Other Ambulatory Visit: Payer: Self-pay | Admitting: Surgery

## 2019-06-19 DIAGNOSIS — Z9049 Acquired absence of other specified parts of digestive tract: Secondary | ICD-10-CM

## 2019-06-20 ENCOUNTER — Other Ambulatory Visit (HOSPITAL_COMMUNITY): Payer: Self-pay | Admitting: Surgery

## 2019-06-20 DIAGNOSIS — Z9049 Acquired absence of other specified parts of digestive tract: Secondary | ICD-10-CM

## 2019-06-28 ENCOUNTER — Other Ambulatory Visit: Payer: Medicaid Other

## 2019-07-04 ENCOUNTER — Other Ambulatory Visit: Payer: Self-pay

## 2019-07-04 ENCOUNTER — Ambulatory Visit (HOSPITAL_COMMUNITY)
Admission: RE | Admit: 2019-07-04 | Discharge: 2019-07-04 | Disposition: A | Discharge status: Home / Self Care | Payer: Medicaid Other | Source: Ambulatory Visit | Attending: Surgery | Admitting: Surgery

## 2019-07-04 DIAGNOSIS — Z9049 Acquired absence of other specified parts of digestive tract: Secondary | ICD-10-CM | POA: Insufficient documentation

## 2019-07-09 ENCOUNTER — Ambulatory Visit: Payer: Self-pay | Admitting: Surgery

## 2019-08-27 NOTE — Progress Notes (Signed)
[facility] [place], [address] AT [address] [place] [address] Phone: [phone] Fax: [phone]  [facility] [place], [place] - [address] [place] [address] Phone: [phone] Fax: [phone]  [id number] - [place], [place] [address] Phone: [phone] Fax: [phone]      Your procedure is scheduled on Friday, August 31, 2019.  Report to [facility] Main Entrance "A" at 11:00 A.M., and check in at the Admitting office.  Call this number if you have problems the morning of surgery:  [phone]  Call [phone] if you have any questions prior to your surgery date Monday-Friday 8am-4pm    Remember:  Do not eat after midnight the night before your surgery  You may drink clear liquids until 10:00 AM the morning of your surgery.   Clear liquids allowed are: Water, Non-Citrus Juices (without pulp), Carbonated Beverages, Clear Tea, Black Coffee Only, and Gatorade  Please complete your PRE-SURGERY ENSURE that was provided to you by 10:00 AM the morning of surgery.  Please, if able, drink it in one setting. DO NOT SIP.     Take these medicines the morning of surgery with A SIP OF WATER: NONE   As of today, stop taking all Aspirin (unless instructed by your doctor) and other Aspirin containing products, Vitamins, Fish Oils, and Herbal Medications. Also stop all NSAIDS i.e. Advil, Ibuprofen, Motrin, Aleve, Anaprox, Naproxen, BC, Goody Powders, and all Supplements.   No Smoking of any kind, Tobacco, or Alcohol products 24 hours prior to your procedure. If you use a CPAP at night, you may bring all equipment for your overnight stay.                        Do not wear jewelry.            Do not wear lotions, powders, colognes, or deodorant.            Do  not shave 48 hours prior to surgery.  Men may shave face and neck.            Do not bring valuables to the hospital.            [facility] is not responsible for any belongings or valuables.   Contacts, glasses, dentures or bridgework may not be worn into surgery.      For patients admitted to the hospital, discharge time will be determined by your treatment team.   Patients discharged the day of surgery will not be allowed to drive home, and someone needs to stay with them for 24 hours.    Special instructions:   [facility]- Preparing For Surgery  Before surgery, you can play an important role. Because skin is not sterile, your skin needs to be as free of germs as possible. You can reduce the number of germs on your skin by washing with CHG (chlorahexidine gluconate) Soap before surgery.  CHG is an antiseptic cleaner which kills germs and bonds with the skin to continue killing germs even after washing.    Oral Hygiene is also important to reduce your risk of infection.  Remember - BRUSH YOUR TEETH THE MORNING OF SURGERY WITH YOUR REGULAR TOOTHPASTE  Please do not use if you have an  allergy to CHG or antibacterial soaps. If your skin becomes reddened/irritated stop using the CHG.  Do not shave (including legs and underarms) for at least 48 hours prior to first CHG shower. It is OK to shave your face.  Please follow these instructions carefully.   1. Shower the NIGHT BEFORE SURGERY and the MORNING OF SURGERY with CHG Soap.   2. If you chose to wash your hair, wash your hair first as usual with your normal shampoo.  3. After you shampoo, rinse your hair and body thoroughly to remove the shampoo.  4. Use CHG as you would any other liquid soap. You can apply CHG directly to the skin and wash gently with a scrungie or a clean washcloth.   5. Apply the CHG Soap to your body ONLY FROM THE NECK DOWN.  Do not use on open wounds or open sores. Avoid contact with your eyes, ears, mouth and  genitals (private parts). Wash Face and genitals (private parts)  with your normal soap.   6. Wash thoroughly, paying special attention to the area where your surgery will be performed.  7. Thoroughly rinse your body with warm water from the neck down.  8. DO NOT shower/wash with your normal soap after using and rinsing off the CHG Soap.  9. Pat yourself dry with a CLEAN TOWEL.  10. Wear CLEAN PAJAMAS to bed the night before surgery, wear comfortable clothes the morning of surgery  11. Place CLEAN SHEETS on your bed the night of your first shower and DO NOT SLEEP WITH PETS.   Day of Surgery:   Do not apply any deodorants/lotions.  Please wear clean clothes to the hospital/surgery center.   Remember to brush your teeth WITH YOUR REGULAR TOOTHPASTE.   Please read over the following fact sheets that you were given.

## 2019-08-28 ENCOUNTER — Encounter (HOSPITAL_COMMUNITY): Payer: Self-pay

## 2019-08-28 ENCOUNTER — Other Ambulatory Visit (HOSPITAL_COMMUNITY)
Admission: RE | Admit: 2019-08-28 | Discharge: 2019-08-28 | Disposition: A | Discharge status: Home / Self Care | Payer: Medicaid Other | Source: Ambulatory Visit | Attending: Surgery | Admitting: Surgery

## 2019-08-28 ENCOUNTER — Encounter (HOSPITAL_COMMUNITY)
Admission: RE | Admit: 2019-08-28 | Discharge: 2019-08-28 | Disposition: A | Discharge status: Home / Self Care | Payer: Medicaid Other | Source: Ambulatory Visit | Attending: Surgery | Admitting: Surgery

## 2019-08-28 ENCOUNTER — Other Ambulatory Visit: Payer: Self-pay

## 2019-08-28 DIAGNOSIS — Z20822 Contact with and (suspected) exposure to covid-19: Secondary | ICD-10-CM | POA: Insufficient documentation

## 2019-08-28 DIAGNOSIS — Z01812 Encounter for preprocedural laboratory examination: Secondary | ICD-10-CM | POA: Diagnosis present

## 2019-08-28 LAB — COMPREHENSIVE METABOLIC PANEL
ALT: 13 U/L (ref 0–44)
AST: 17 U/L (ref 15–41)
Albumin: 4.3 g/dL (ref 3.5–5.0)
Alkaline Phosphatase: 44 U/L (ref 38–126)
Anion gap: 9 (ref 5–15)
BUN: 12 mg/dL (ref 6–20)
CO2: 25 mmol/L (ref 22–32)
Calcium: 9.7 mg/dL (ref 8.9–10.3)
Chloride: 107 mmol/L (ref 98–111)
Creatinine, Ser: 1.19 mg/dL (ref 0.61–1.24)
GFR calc Af Amer: >60 mL/min (ref >60)
GFR calc non Af Amer: >60 mL/min (ref >60)
Glucose, Bld: 90 mg/dL (ref 70–99)
Potassium: 4.2 mmol/L (ref 3.5–5.1)
Sodium: 141 mmol/L (ref 135–145)
Total Bilirubin: 1.1 mg/dL (ref 0.3–1.2)
Total Protein: 7.3 g/dL (ref 6.5–8.1)

## 2019-08-28 LAB — CBC
HCT: 43.9 % (ref 39.0–52.0)
Hemoglobin: 14 g/dL (ref 13.0–17.0)
MCH: 25.5 pg — ABNORMAL LOW (ref 26.0–34.0)
MCHC: 31.9 g/dL (ref 30.0–36.0)
MCV: 79.8 fL — ABNORMAL LOW (ref 80.0–100.0)
Platelets: 271 10*3/uL (ref 150–400)
RBC: 5.5 MIL/uL (ref 4.22–5.81)
RDW: 16.8 % — ABNORMAL HIGH (ref 11.5–15.5)
WBC: 5.7 10*3/uL (ref 4.0–10.5)
nRBC: 0 % (ref 0.0–0.2)

## 2019-08-28 LAB — HEMOGLOBIN A1C
Hgb A1c MFr Bld: 5.7 % — ABNORMAL HIGH (ref 4.8–5.6)
Mean Plasma Glucose: 116.89 mg/dL

## 2019-08-28 LAB — SARS CORONAVIRUS 2 (TAT 6-24 HRS): SARS Coronavirus 2: NEGATIVE

## 2019-08-28 NOTE — Progress Notes (Signed)
PCP - Denies Cardiologist - Denies  PPM/ICD - Denies  Chest x-ray - N/A EKG - N/A Stress Test - Denies ECHO - Denies Cardiac Cath - Denies  Sleep Study - Denies  Pt denies being diabetic.  Blood Thinner Instructions: N/A Aspirin Instructions: N/A  ERAS Protcol - Yes, PRE-SURGERY Ensure  COVID TEST- 08/28/19   Coronavirus Screening  Have you experienced the following symptoms:  Cough yes/no: No Fever (>100.45F)  yes/no: No Runny nose yes/no: No Sore throat yes/no: No Difficulty breathing/shortness of breath  yes/no: No  Have you or a family member traveled in the last 14 days and where? yes/no: No   If the patient indicates "YES" to the above questions, their PAT will be rescheduled to limit the exposure to others and, the surgeon will be notified. THE PATIENT WILL NEED TO BE ASYMPTOMATIC FOR 14 DAYS.   If the patient is not experiencing any of these symptoms, the PAT nurse will instruct them to NOT bring anyone with them to their appointment since they may have these symptoms or traveled as well.   Please remind your patients and families that hospital visitation restrictions are in effect and the importance of the restrictions.     Anesthesia review: No  Patient denies shortness of breath, fever, cough and chest pain at PAT appointment   All instructions explained to the patient, with a verbal understanding of the material. Patient agrees to go over the instructions while at home for a better understanding. Patient also instructed to self quarantine after being tested for COVID-19. The opportunity to ask questions was provided.

## 2019-09-07 ENCOUNTER — Other Ambulatory Visit (HOSPITAL_COMMUNITY)
Admission: RE | Admit: 2019-09-07 | Discharge: 2019-09-07 | Disposition: A | Discharge status: Home / Self Care | Payer: Medicaid Other | Source: Ambulatory Visit | Attending: Surgery | Admitting: Surgery

## 2019-09-07 DIAGNOSIS — Z01812 Encounter for preprocedural laboratory examination: Secondary | ICD-10-CM | POA: Insufficient documentation

## 2019-09-07 DIAGNOSIS — Z20822 Contact with and (suspected) exposure to covid-19: Secondary | ICD-10-CM | POA: Insufficient documentation

## 2019-09-07 LAB — SARS CORONAVIRUS 2 (TAT 6-24 HRS): SARS Coronavirus 2: NEGATIVE

## 2019-09-11 ENCOUNTER — Encounter (HOSPITAL_COMMUNITY)
Admission: RE | Disposition: A | Discharge status: Home / Self Care | Payer: Self-pay | Source: Home / Self Care | Attending: Surgery

## 2019-09-11 ENCOUNTER — Encounter (HOSPITAL_COMMUNITY): Payer: Self-pay | Admitting: Anesthesiology

## 2019-09-11 ENCOUNTER — Other Ambulatory Visit: Payer: Self-pay

## 2019-09-11 ENCOUNTER — Ambulatory Visit (HOSPITAL_COMMUNITY)
Admission: RE | Admit: 2019-09-11 | Discharge: 2019-09-11 | Disposition: A | Discharge status: Home / Self Care | Payer: Medicaid Other | Attending: Surgery | Admitting: Surgery

## 2019-09-11 ENCOUNTER — Encounter (HOSPITAL_COMMUNITY): Payer: Self-pay | Admitting: Surgery

## 2019-09-11 DIAGNOSIS — Z5309 Procedure and treatment not carried out because of other contraindication: Secondary | ICD-10-CM | POA: Diagnosis not present

## 2019-09-11 DIAGNOSIS — Z933 Colostomy status: Secondary | ICD-10-CM | POA: Diagnosis present

## 2019-09-11 SURGERY — COLOSTOMY REVERSAL
Anesthesia: General

## 2019-09-11 MED ORDER — ALVIMOPAN 12 MG PO CAPS: 12.0000 mg | ORAL_CAPSULE | ORAL | Status: DC

## 2019-09-11 MED ORDER — CHLORHEXIDINE GLUCONATE CLOTH 2 % EX PADS: 6.0000 | MEDICATED_PAD | Freq: Once | CUTANEOUS | Status: DC

## 2019-09-11 MED ORDER — LACTATED RINGERS IV SOLN: INTRAVENOUS | Status: DC

## 2019-09-11 MED ORDER — ENOXAPARIN SODIUM 40 MG/0.4ML ~~LOC~~ SOLN: 40.0000 mg | Freq: Once | SUBCUTANEOUS | Status: DC

## 2019-09-11 MED ORDER — SODIUM CHLORIDE 0.9 % IV SOLN
INTRAVENOUS | Status: AC
  Filled 2019-09-11: qty 2

## 2019-09-11 MED ORDER — SODIUM CHLORIDE 0.9 % IV SOLN: 2.0000 g | INTRAVENOUS | Status: DC

## 2019-09-11 MED ORDER — ALVIMOPAN 12 MG PO CAPS
ORAL_CAPSULE | ORAL | Status: AC
  Filled 2019-09-11: qty 1

## 2019-09-11 MED ORDER — ACETAMINOPHEN 500 MG PO TABS
ORAL_TABLET | ORAL | Status: AC
  Filled 2019-09-11: qty 2

## 2019-09-11 MED ORDER — ENOXAPARIN SODIUM 40 MG/0.4ML ~~LOC~~ SOLN
SUBCUTANEOUS | Status: AC
  Filled 2019-09-11: qty 0.4

## 2019-09-11 MED ORDER — FENTANYL CITRATE (PF) 250 MCG/5ML IJ SOLN
INTRAMUSCULAR | Status: AC
  Filled 2019-09-11: qty 5

## 2019-09-11 MED ORDER — MIDAZOLAM HCL 2 MG/2ML IJ SOLN
INTRAMUSCULAR | Status: AC
  Filled 2019-09-11: qty 2

## 2019-09-11 MED ORDER — ACETAMINOPHEN 500 MG PO TABS: 1000.0000 mg | ORAL_TABLET | ORAL | Status: DC

## 2019-09-11 NOTE — Anesthesia Preprocedure Evaluation (Deleted)
Anesthesia Evaluation  Patient identified by MRN, date of birth, ID band Patient awake    Reviewed: Allergy & Precautions, NPO status , Patient's Chart, lab work & pertinent test results  Airway Mallampati: II  TM Distance: >3 FB Neck ROM: Full    Dental  (+) Teeth Intact, Dental Advisory Given   Pulmonary Patient abstained from smoking.,    breath sounds clear to auscultation       Cardiovascular  Rate:Normal     Neuro/Psych    GI/Hepatic   Endo/Other    Renal/GU      Musculoskeletal   Abdominal   Peds  Hematology   Anesthesia Other Findings   Reproductive/Obstetrics                             Anesthesia Physical Anesthesia Plan  ASA: II  Anesthesia Plan: General   Post-op Pain Management:    Induction: Intravenous  PONV Risk Score and Plan: Ondansetron and Dexamethasone  Airway Management Planned: Oral ETT  Additional Equipment:   Intra-op Plan:   Post-operative Plan: Extubation in OR  Informed Consent: I have reviewed the patients History and Physical, chart, labs and discussed the procedure including the risks, benefits and alternatives for the proposed anesthesia with the patient or authorized representative who has indicated his/her understanding and acceptance.     Dental advisory given  Plan Discussed with: CRNA and Anesthesiologist  Anesthesia Plan Comments:         Anesthesia Quick Evaluation
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
## 2019-09-11 NOTE — Progress Notes (Signed)
Procedure cancelled due to patient not completing bowel prep. Dr. Bedelia Person spoke with patient about rescheduling surgery and his new bowel prep. She told him her nurse will call in the prescription and will call him to set up the time for surgery. Patient discharged to home.

## 2019-09-28 ENCOUNTER — Encounter (HOSPITAL_COMMUNITY): Payer: Self-pay | Admitting: Surgery

## 2019-09-28 ENCOUNTER — Ambulatory Visit: Payer: Self-pay | Admitting: Surgery

## 2019-09-28 ENCOUNTER — Other Ambulatory Visit (HOSPITAL_COMMUNITY)
Admission: RE | Admit: 2019-09-28 | Discharge: 2019-09-28 | Disposition: A | Discharge status: Home / Self Care | Payer: Medicaid Other | Source: Ambulatory Visit | Attending: Surgery | Admitting: Surgery

## 2019-09-28 DIAGNOSIS — Z20822 Contact with and (suspected) exposure to covid-19: Secondary | ICD-10-CM | POA: Insufficient documentation

## 2019-09-28 DIAGNOSIS — Z01812 Encounter for preprocedural laboratory examination: Secondary | ICD-10-CM | POA: Insufficient documentation

## 2019-09-28 LAB — SARS CORONAVIRUS 2 (TAT 6-24 HRS): SARS Coronavirus 2: NEGATIVE

## 2019-09-28 NOTE — Progress Notes (Signed)
Austin Morales denies chest pain or shortness of breath.  Patient was tested for Covid today and is in quarantine.

## 2019-10-02 ENCOUNTER — Encounter (HOSPITAL_COMMUNITY): Payer: Self-pay | Admitting: Surgery

## 2019-10-02 ENCOUNTER — Encounter (HOSPITAL_COMMUNITY): Admission: RE | Disposition: A | Discharge status: Home / Self Care | Payer: Self-pay | Source: Home / Self Care

## 2019-10-02 ENCOUNTER — Inpatient Hospital Stay (HOSPITAL_COMMUNITY): Payer: Medicaid Other

## 2019-10-02 ENCOUNTER — Inpatient Hospital Stay (HOSPITAL_COMMUNITY)
Admission: RE | Admit: 2019-10-02 | Discharge: 2019-10-06 | DRG: 331 | Disposition: A | Discharge status: Home / Self Care | Payer: Medicaid Other | Attending: Surgery | Admitting: Surgery

## 2019-10-02 ENCOUNTER — Other Ambulatory Visit: Payer: Self-pay

## 2019-10-02 DIAGNOSIS — Z433 Encounter for attention to colostomy: Principal | ICD-10-CM

## 2019-10-02 DIAGNOSIS — Z9889 Other specified postprocedural states: Secondary | ICD-10-CM | POA: Diagnosis present

## 2019-10-02 HISTORY — PX: COLOSTOMY REVERSAL: SHX5782

## 2019-10-02 LAB — COMPREHENSIVE METABOLIC PANEL
ALT: 16 U/L (ref 0–44)
AST: 21 U/L (ref 15–41)
Albumin: 4.4 g/dL (ref 3.5–5.0)
Alkaline Phosphatase: 45 U/L (ref 38–126)
Anion gap: 10 (ref 5–15)
BUN: 10 mg/dL (ref 6–20)
CO2: 24 mmol/L (ref 22–32)
Calcium: 9.6 mg/dL (ref 8.9–10.3)
Chloride: 103 mmol/L (ref 98–111)
Creatinine, Ser: 1.18 mg/dL (ref 0.61–1.24)
GFR calc Af Amer: >60 mL/min (ref >60)
GFR calc non Af Amer: >60 mL/min (ref >60)
Glucose, Bld: 83 mg/dL (ref 70–99)
Potassium: 3.7 mmol/L (ref 3.5–5.1)
Sodium: 137 mmol/L (ref 135–145)
Total Bilirubin: 0.7 mg/dL (ref 0.3–1.2)
Total Protein: 7.3 g/dL (ref 6.5–8.1)

## 2019-10-02 LAB — CBC
HCT: 44.4 % (ref 39.0–52.0)
Hemoglobin: 14.4 g/dL (ref 13.0–17.0)
MCH: 26.5 pg (ref 26.0–34.0)
MCHC: 32.4 g/dL (ref 30.0–36.0)
MCV: 81.8 fL (ref 80.0–100.0)
Platelets: 276 10*3/uL (ref 150–400)
RBC: 5.43 MIL/uL (ref 4.22–5.81)
RDW: 14.9 % (ref 11.5–15.5)
WBC: 5.8 10*3/uL (ref 4.0–10.5)
nRBC: 0 % (ref 0.0–0.2)

## 2019-10-02 SURGERY — COLOSTOMY REVERSAL
Anesthesia: General | Site: Abdomen

## 2019-10-02 MED ORDER — FENTANYL CITRATE (PF) 100 MCG/2ML IJ SOLN
25.0000 ug | INTRAMUSCULAR | Status: DC | PRN
  Administered 2019-10-02 (×4): 25 ug via INTRAVENOUS

## 2019-10-02 MED ORDER — OXYCODONE HCL 5 MG/5ML PO SOLN: 5.0000 mg | Freq: Once | ORAL | Status: DC | PRN

## 2019-10-02 MED ORDER — OXYCODONE HCL 5 MG PO TABS: 5.0000 mg | ORAL_TABLET | Freq: Once | ORAL | Status: DC | PRN

## 2019-10-02 MED ORDER — DEXAMETHASONE SODIUM PHOSPHATE 10 MG/ML IJ SOLN
INTRAMUSCULAR | Status: AC
  Filled 2019-10-02: qty 1

## 2019-10-02 MED ORDER — PROPOFOL 10 MG/ML IV BOLUS
INTRAVENOUS | Status: AC
  Filled 2019-10-02: qty 20

## 2019-10-02 MED ORDER — FENTANYL CITRATE (PF) 250 MCG/5ML IJ SOLN
INTRAMUSCULAR | Status: DC | PRN
  Administered 2019-10-02 (×2): 50 ug via INTRAVENOUS
  Administered 2019-10-02: 100 ug via INTRAVENOUS
  Administered 2019-10-02: 50 ug via INTRAVENOUS

## 2019-10-02 MED ORDER — ALVIMOPAN 12 MG PO CAPS
ORAL_CAPSULE | ORAL | Status: AC
  Administered 2019-10-02: 12 mg via ORAL
  Filled 2019-10-02: qty 1

## 2019-10-02 MED ORDER — DEXMEDETOMIDINE HCL IN NACL 200 MCG/50ML IV SOLN
INTRAVENOUS | Status: DC | PRN
  Administered 2019-10-02: 8 ug via INTRAVENOUS
  Administered 2019-10-02 (×2): 4 ug via INTRAVENOUS

## 2019-10-02 MED ORDER — OXYCODONE HCL 5 MG PO TABS
5.0000 mg | ORAL_TABLET | ORAL | Status: DC | PRN
  Administered 2019-10-03 – 2019-10-06 (×8): 10 mg via ORAL
  Filled 2019-10-02 (×9): qty 2

## 2019-10-02 MED ORDER — ENOXAPARIN SODIUM 40 MG/0.4ML ~~LOC~~ SOLN
SUBCUTANEOUS | Status: AC
  Administered 2019-10-02: 10:00:00 40 mg via SUBCUTANEOUS
  Filled 2019-10-02: qty 0.4

## 2019-10-02 MED ORDER — ONDANSETRON HCL 4 MG/2ML IJ SOLN
INTRAMUSCULAR | Status: DC | PRN
  Administered 2019-10-02: 4 mg via INTRAVENOUS

## 2019-10-02 MED ORDER — ROCURONIUM BROMIDE 10 MG/ML (PF) SYRINGE
PREFILLED_SYRINGE | INTRAVENOUS | Status: AC
  Filled 2019-10-02: qty 10

## 2019-10-02 MED ORDER — KETOROLAC TROMETHAMINE 15 MG/ML IJ SOLN
15.0000 mg | Freq: Four times a day (QID) | INTRAMUSCULAR | Status: DC | PRN
  Administered 2019-10-03 – 2019-10-06 (×2): 15 mg via INTRAVENOUS
  Filled 2019-10-02 (×2): qty 1

## 2019-10-02 MED ORDER — FENTANYL CITRATE (PF) 100 MCG/2ML IJ SOLN
INTRAMUSCULAR | Status: AC
  Filled 2019-10-02: qty 2

## 2019-10-02 MED ORDER — PROMETHAZINE HCL 25 MG/ML IJ SOLN
6.2500 mg | INTRAMUSCULAR | Status: DC | PRN
  Administered 2019-10-02: 14:00:00 6.25 mg via INTRAVENOUS

## 2019-10-02 MED ORDER — FENTANYL CITRATE (PF) 250 MCG/5ML IJ SOLN
INTRAMUSCULAR | Status: AC
  Filled 2019-10-02: qty 5

## 2019-10-02 MED ORDER — PROPOFOL 10 MG/ML IV BOLUS
INTRAVENOUS | Status: DC | PRN
  Administered 2019-10-02: 200 mg via INTRAVENOUS

## 2019-10-02 MED ORDER — MIDAZOLAM HCL 2 MG/2ML IJ SOLN
INTRAMUSCULAR | Status: AC
  Filled 2019-10-02: qty 2

## 2019-10-02 MED ORDER — ENOXAPARIN SODIUM 40 MG/0.4ML ~~LOC~~ SOLN: 40.0000 mg | Freq: Once | SUBCUTANEOUS | Status: AC

## 2019-10-02 MED ORDER — METRONIDAZOLE IN NACL 5-0.79 MG/ML-% IV SOLN
INTRAVENOUS | Status: AC
  Filled 2019-10-02: qty 100

## 2019-10-02 MED ORDER — METHOCARBAMOL 1000 MG/10ML IJ SOLN
1000.0000 mg | Freq: Three times a day (TID) | INTRAVENOUS | Status: DC
  Administered 2019-10-02 – 2019-10-06 (×11): 1000 mg via INTRAVENOUS
  Filled 2019-10-02 (×13): qty 10

## 2019-10-02 MED ORDER — ONDANSETRON HCL 4 MG/2ML IJ SOLN
4.0000 mg | Freq: Four times a day (QID) | INTRAMUSCULAR | Status: DC | PRN
  Administered 2019-10-02 – 2019-10-03 (×2): 4 mg via INTRAVENOUS
  Filled 2019-10-02 (×2): qty 2

## 2019-10-02 MED ORDER — ONDANSETRON HCL 4 MG/2ML IJ SOLN
INTRAMUSCULAR | Status: AC
  Filled 2019-10-02: qty 2

## 2019-10-02 MED ORDER — 0.9 % SODIUM CHLORIDE (POUR BTL) OPTIME
TOPICAL | Status: DC | PRN
  Administered 2019-10-02: 2000 mL

## 2019-10-02 MED ORDER — CHLORHEXIDINE GLUCONATE CLOTH 2 % EX PADS: 6.0000 | MEDICATED_PAD | Freq: Once | CUTANEOUS | Status: DC

## 2019-10-02 MED ORDER — LIDOCAINE 2% (20 MG/ML) 5 ML SYRINGE
INTRAMUSCULAR | Status: DC | PRN
  Administered 2019-10-02: 60 mg via INTRAVENOUS

## 2019-10-02 MED ORDER — LACTATED RINGERS IV SOLN: INTRAVENOUS | Status: DC

## 2019-10-02 MED ORDER — ROCURONIUM BROMIDE 50 MG/5ML IV SOSY
PREFILLED_SYRINGE | INTRAVENOUS | Status: DC | PRN
  Administered 2019-10-02: 10 mg via INTRAVENOUS
  Administered 2019-10-02: 60 mg via INTRAVENOUS

## 2019-10-02 MED ORDER — CEFAZOLIN SODIUM-DEXTROSE 2-4 GM/100ML-% IV SOLN
2.0000 g | INTRAVENOUS | Status: AC
  Administered 2019-10-02: 2 g via INTRAVENOUS

## 2019-10-02 MED ORDER — ENOXAPARIN SODIUM 40 MG/0.4ML ~~LOC~~ SOLN
40.0000 mg | SUBCUTANEOUS | Status: DC
  Administered 2019-10-03 – 2019-10-06 (×4): 40 mg via SUBCUTANEOUS
  Filled 2019-10-02 (×4): qty 0.4

## 2019-10-02 MED ORDER — DEXAMETHASONE SODIUM PHOSPHATE 10 MG/ML IJ SOLN
INTRAMUSCULAR | Status: DC | PRN
  Administered 2019-10-02: 4 mg via INTRAVENOUS

## 2019-10-02 MED ORDER — ALVIMOPAN 12 MG PO CAPS: 12.0000 mg | ORAL_CAPSULE | ORAL | Status: AC

## 2019-10-02 MED ORDER — LIDOCAINE 2% (20 MG/ML) 5 ML SYRINGE
INTRAMUSCULAR | Status: AC
  Filled 2019-10-02: qty 5

## 2019-10-02 MED ORDER — ONDANSETRON 4 MG PO TBDP: 4.0000 mg | ORAL_TABLET | Freq: Four times a day (QID) | ORAL | Status: DC | PRN

## 2019-10-02 MED ORDER — DOCUSATE SODIUM 100 MG PO CAPS
100.0000 mg | ORAL_CAPSULE | Freq: Two times a day (BID) | ORAL | Status: DC
  Administered 2019-10-03 – 2019-10-06 (×6): 100 mg via ORAL
  Filled 2019-10-02 (×7): qty 1

## 2019-10-02 MED ORDER — ACETAMINOPHEN 10 MG/ML IV SOLN
1000.0000 mg | Freq: Four times a day (QID) | INTRAVENOUS | Status: AC
  Administered 2019-10-02 – 2019-10-03 (×4): 1000 mg via INTRAVENOUS
  Filled 2019-10-02 (×4): qty 100

## 2019-10-02 MED ORDER — METRONIDAZOLE IN NACL 5-0.79 MG/ML-% IV SOLN
500.0000 mg | INTRAVENOUS | Status: AC
  Administered 2019-10-02: 500 mg via INTRAVENOUS

## 2019-10-02 MED ORDER — SUGAMMADEX SODIUM 200 MG/2ML IV SOLN
INTRAVENOUS | Status: DC | PRN
  Administered 2019-10-02: 120 mg via INTRAVENOUS

## 2019-10-02 MED ORDER — PROMETHAZINE HCL 25 MG/ML IJ SOLN
INTRAMUSCULAR | Status: AC
  Filled 2019-10-02: qty 1

## 2019-10-02 MED ORDER — CEFAZOLIN SODIUM-DEXTROSE 2-4 GM/100ML-% IV SOLN
INTRAVENOUS | Status: AC
  Filled 2019-10-02: qty 100

## 2019-10-02 MED ORDER — MIDAZOLAM HCL 5 MG/5ML IJ SOLN
INTRAMUSCULAR | Status: DC | PRN
  Administered 2019-10-02: 2 mg via INTRAVENOUS

## 2019-10-02 MED ORDER — MORPHINE SULFATE (PF) 4 MG/ML IV SOLN
4.0000 mg | INTRAVENOUS | Status: DC | PRN
  Administered 2019-10-02 (×2): 4 mg via INTRAVENOUS
  Filled 2019-10-02 (×2): qty 1

## 2019-10-02 SURGICAL SUPPLY — 42 items
BLADE CLIPPER SURG (BLADE) ×3 IMPLANT
CANISTER SUCT 3000ML PPV (MISCELLANEOUS) ×3 IMPLANT
COVER SURGICAL LIGHT HANDLE (MISCELLANEOUS) ×6 IMPLANT
COVER WAND RF STERILE (DRAPES) ×3 IMPLANT
DRESSING TELFA 8X10 (GAUZE/BANDAGES/DRESSINGS) ×3 IMPLANT
ELECT CAUTERY BLADE 6.4 (BLADE) ×3 IMPLANT
ELECT REM PT RETURN 9FT ADLT (ELECTROSURGICAL) ×3
ELECTRODE REM PT RTRN 9FT ADLT (ELECTROSURGICAL) ×1 IMPLANT
GAUZE SPONGE 4X4 12PLY STRL LF (GAUZE/BANDAGES/DRESSINGS) ×3 IMPLANT
GLOVE BIO SURGEON STRL SZ 6.5 (GLOVE) ×4 IMPLANT
GLOVE BIO SURGEONS STRL SZ 6.5 (GLOVE) ×2
GLOVE BIOGEL PI IND STRL 6 (GLOVE) ×2 IMPLANT
GLOVE BIOGEL PI INDICATOR 6 (GLOVE) ×4
GOWN STRL REUS W/ TWL LRG LVL3 (GOWN DISPOSABLE) ×4 IMPLANT
GOWN STRL REUS W/ TWL XL LVL3 (GOWN DISPOSABLE) ×2 IMPLANT
GOWN STRL REUS W/TWL LRG LVL3 (GOWN DISPOSABLE) ×8
GOWN STRL REUS W/TWL XL LVL3 (GOWN DISPOSABLE) ×4
KIT TURNOVER KIT B (KITS) ×3 IMPLANT
LIGASURE IMPACT 36 18CM CVD LR (INSTRUMENTS) ×3 IMPLANT
NS IRRIG 1000ML POUR BTL (IV SOLUTION) ×6 IMPLANT
PACK COLON (CUSTOM PROCEDURE TRAY) ×3 IMPLANT
PAD ARMBOARD 7.5X6 YLW CONV (MISCELLANEOUS) ×3 IMPLANT
PENCIL SMOKE EVACUATOR (MISCELLANEOUS) ×3 IMPLANT
RELOAD PROXIMATE 75MM BLUE (ENDOMECHANICALS) ×3 IMPLANT
SPONGE LAP 18X18 RF (DISPOSABLE) ×6 IMPLANT
SPONGE LAP 18X18 X RAY DECT (DISPOSABLE) ×3 IMPLANT
STAPLER GUN LINEAR PROX 60 (STAPLE) ×3 IMPLANT
STAPLER PROXIMATE 75MM BLUE (STAPLE) ×3 IMPLANT
STAPLER VISISTAT 35W (STAPLE) ×3 IMPLANT
SUT PDS AB 1 TP1 54 (SUTURE) IMPLANT
SUT PDS AB 1 TP1 96 (SUTURE) ×9 IMPLANT
SUT PROLENE 2 0 CT2 30 (SUTURE) IMPLANT
SUT PROLENE 2 0 KS (SUTURE) IMPLANT
SUT SILK 2 0 SH CR/8 (SUTURE) ×3 IMPLANT
SUT SILK 2 0 TIES 10X30 (SUTURE) ×3 IMPLANT
SUT SILK 3 0 SH CR/8 (SUTURE) ×3 IMPLANT
SUT SILK 3 0 TIES 10X30 (SUTURE) ×3 IMPLANT
SUT VIC AB 3-0 SH 18 (SUTURE) ×6 IMPLANT
TAPE CLOTH SURG 4X10 WHT LF (GAUZE/BANDAGES/DRESSINGS) ×3 IMPLANT
TRAY FOLEY MTR SLVR 14FR STAT (SET/KITS/TRAYS/PACK) ×3 IMPLANT
TUBE CONNECTING 12'X1/4 (SUCTIONS) ×2
TUBE CONNECTING 12X1/4 (SUCTIONS) ×4 IMPLANT
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
## 2019-10-02 NOTE — Anesthesia Postprocedure Evaluation (Signed)
Anesthesia Post Note  Patient: Austin Morales  Procedure(s) Performed: COLOSTOMY REVERSAL (N/A Abdomen)     Patient location during evaluation: PACU Anesthesia Type: General Level of consciousness: awake Pain management: pain level controlled Respiratory status: spontaneous breathing Cardiovascular status: stable Postop Assessment: no apparent nausea or vomiting Anesthetic complications: no    Last Vitals:  Vitals:   10/02/19 1645 10/02/19 1650  BP:    Pulse: (!) 55 (!) 56  Resp: 11 14  Temp:  36.4 C  SpO2: 100% 100%    Last Pain:  Vitals:   10/02/19 1430  TempSrc:   PainSc: 10-Worst pain ever                 Analina Filla
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
## 2019-10-02 NOTE — Transfer of Care (Signed)
Immediate Anesthesia Transfer of Care Note  Patient: Austin Morales  Procedure(s) Performed: COLOSTOMY REVERSAL (N/A Abdomen)  Patient Location: PACU  Anesthesia Type:General  Level of Consciousness: awake, alert  and oriented  Airway & Oxygen Therapy: Patient Spontanous Breathing and Patient connected to face mask oxygen  Post-op Assessment: Report given to RN and Post -op Vital signs reviewed and stable  Post vital signs: Reviewed and stable  Last Vitals:  Vitals Value Taken Time  BP 140/98 10/02/19 1238  Temp    Pulse 60 10/02/19 1242  Resp 12 10/02/19 1242  SpO2 100 % 10/02/19 1242  Vitals shown include unvalidated device data.  Last Pain:  Vitals:   10/02/19 1001  TempSrc:   PainSc: 0-No pain      Patients Stated Pain Goal: 3 (10/02/19 1001)  Complications: No apparent anesthesia complications
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
## 2019-10-02 NOTE — Anesthesia Procedure Notes (Signed)
Procedure Name: Intubation Date/Time: 10/02/2019 10:47 AM Performed by: Macie Burows, CRNA Pre-anesthesia Checklist: Patient identified, Suction available, Emergency Drugs available and Patient being monitored Patient Re-evaluated:Patient Re-evaluated prior to induction Oxygen Delivery Method: Circle system utilized Preoxygenation: Pre-oxygenation with 100% oxygen Induction Type: IV induction Ventilation: Mask ventilation without difficulty Laryngoscope Size: Miller and 2 Grade View: Grade I Tube type: Oral Tube size: 7.5 mm Number of attempts: 1 Airway Equipment and Method: Stylet Placement Confirmation: ETT inserted through vocal cords under direct vision,  positive ETCO2,  CO2 detector and breath sounds checked- equal and bilateral Secured at: 21 cm Tube secured with: Tape Dental Injury: Teeth and Oropharynx as per pre-operative assessment  Comments: Intubation performed by Janey Greaser with Dr. Mal Amabile supervising

## 2019-10-03 LAB — CBC
HCT: 37 % — ABNORMAL LOW (ref 39.0–52.0)
Hemoglobin: 12.3 g/dL — ABNORMAL LOW (ref 13.0–17.0)
MCH: 26.7 pg (ref 26.0–34.0)
MCHC: 33.2 g/dL (ref 30.0–36.0)
MCV: 80.3 fL (ref 80.0–100.0)
Platelets: 251 10*3/uL (ref 150–400)
RBC: 4.61 MIL/uL (ref 4.22–5.81)
RDW: 14.5 % (ref 11.5–15.5)
WBC: 12.4 10*3/uL — ABNORMAL HIGH (ref 4.0–10.5)
nRBC: 0 % (ref 0.0–0.2)

## 2019-10-03 LAB — SURGICAL PATHOLOGY

## 2019-10-03 LAB — BASIC METABOLIC PANEL
Anion gap: 15 (ref 5–15)
BUN: 7 mg/dL (ref 6–20)
CO2: 19 mmol/L — ABNORMAL LOW (ref 22–32)
Calcium: 9.2 mg/dL (ref 8.9–10.3)
Chloride: 101 mmol/L (ref 98–111)
Creatinine, Ser: 1 mg/dL (ref 0.61–1.24)
GFR calc Af Amer: >60 mL/min (ref >60)
GFR calc non Af Amer: >60 mL/min (ref >60)
Glucose, Bld: 108 mg/dL — ABNORMAL HIGH (ref 70–99)
Potassium: 4.3 mmol/L (ref 3.5–5.1)
Sodium: 135 mmol/L (ref 135–145)

## 2019-10-03 MED ORDER — PHENOL 1.4 % MT LIQD
1.0000 | OROMUCOSAL | Status: DC | PRN
  Administered 2019-10-03: 05:00:00 1 via OROMUCOSAL
  Filled 2019-10-03: qty 177

## 2019-10-03 NOTE — Progress Notes (Signed)
[facility] Progress Note  1 Day Post-Op  Subjective: Patient reports pain from NGT. Had a bloody BM after surgery yesterday. Less pain in abdomen than he expected. Plans to walk this afternoon.   Objective: Vital signs in last 24 hours: Temp:  [97 F (36.1 C)-98.5 F (36.9 C)] 98 F (36.7 C) (04/28 0515) Pulse Rate:  [50-98] 61 (04/28 0515) Resp:  [10-19] 16 (04/28 0515) BP: (123-152)/(70-107) 124/70 (04/28 0515) SpO2:  [94 %-100 %] 99 % (04/28 0515) Last BM Date: 10/02/19  Intake/Output from previous day: 04/27 0701 - 04/28 0700 In: 2395.5 [I.V.:2050; NG/GT:30; IV Piggyback:315.5] Out: 1496 [Urine:1245; Emesis/NG output:150; Stool:1; Blood:100] Intake/Output this shift: Total I/O In: 300 [P.O.:300] Out: 775 [Urine:775]  PE: General: pleasant, NAD Heart: regular, rate, and rhythm.  Normal s1,s2. No obvious murmurs, gallops, or rubs noted.  Palpable radial and pedal pulses bilaterally Lungs: CTAB, no wheezes, rhonchi, or rales noted.  Respiratory effort nonlabored Abd: soft, appropriately ttp, ND, +BS, prior colostomy site with penrose and staples present     Lab Results:  Recent Labs    10/02/19 0944 10/03/19 0053  WBC 5.8 12.4*  HGB 14.4 12.3*  HCT 44.4 37.0*  PLT 276 251   BMET Recent Labs    10/02/19 0944 10/03/19 0053  NA 137 135  K 3.7 4.3  CL 103 101  CO2 24 19*  GLUCOSE 83 108*  BUN 10 7  CREATININE 1.18 1.00  CALCIUM 9.6 9.2   PT/INR No results for input(s): LABPROT, INR in the last 72 hours. CMP     Component Value Date/Time   NA 135 10/03/2019 0053   K 4.3 10/03/2019 0053   CL 101 10/03/2019 0053   CO2 19 (L) 10/03/2019 0053   GLUCOSE 108 (H) 10/03/2019 0053   BUN 7 10/03/2019 0053   CREATININE 1.00 10/03/2019 0053   CALCIUM 9.2 10/03/2019 0053   PROT 7.3 10/02/2019 0944   ALBUMIN 4.4 10/02/2019 0944   AST 21 10/02/2019 0944   ALT 16 10/02/2019 0944   ALKPHOS 45 10/02/2019 0944   BILITOT 0.7 10/02/2019 0944    GFRNONAA >60 10/03/2019 0053   GFRAA >60 10/03/2019 0053   Lipase  No results found for: LIPASE     Studies/Results: No results found.  Anti-infectives: Anti-infectives (From admission, onward)   Start     Dose/Rate Route Frequency Ordered Stop   10/02/19 0945  ceFAZolin (ANCEF) IVPB 2g/100 mL premix     2 g 200 mL/hr over 30 Minutes Intravenous On call to O.R. 10/02/19 0934 10/02/19 1050   10/02/19 0945  metroNIDAZOLE (FLAGYL) IVPB 500 mg     500 mg 100 mL/hr over 60 Minutes Intravenous On call to O.R. 10/02/19 0934 10/02/19 1057   10/02/19 0940  metroNIDAZOLE (FLAGYL) 5-0.79 MG/ML-% IVPB    Note to Pharmacy: Larose Hires, Lindsi   : cabinet override      10/02/19 0940 10/02/19 1057   10/02/19 0940  ceFAZolin (ANCEF) 2-4 GM/100ML-% IVPB    Note to Pharmacy: Larose Hires, Lindsi   : cabinet override      10/02/19 0940 10/02/19 1051       Assessment/Plan S/p colostomy reversal 10/02/19 Dr. Bedelia Person - POD#1 - removed NGT, allow CLD - no advancement until more bowel function  - repeat labs in AM - await return in bowel function  - change dressing over prior stoma site BID  FEN: CLD, IVF VTE: SCDs, lovenox ID: [email or web address] pre-op   LOS: 1 day  Norm Parcel , [facility] 10/03/2019, 10:30 AM Please see Amion for pager number during day hours 7:00am-4:30pm

## 2019-10-03 NOTE — Social Work (Signed)
CSW met with pt at bedside. CSW introduced self and explained her role. CSW completed sbirt with pt.  Pt scored a 0 on the sbirt scale. Pt denied alcohol use. Pt denied substance use. Pt did not need resources at this time.  Emeterio Reeve, Latanya Presser, [place] Social Worker [phone]

## 2019-10-04 LAB — CBC
HCT: 32.1 % — ABNORMAL LOW (ref 39.0–52.0)
Hemoglobin: 10.5 g/dL — ABNORMAL LOW (ref 13.0–17.0)
MCH: 26.4 pg (ref 26.0–34.0)
MCHC: 32.7 g/dL (ref 30.0–36.0)
MCV: 80.7 fL (ref 80.0–100.0)
Platelets: 221 10*3/uL (ref 150–400)
RBC: 3.98 MIL/uL — ABNORMAL LOW (ref 4.22–5.81)
RDW: 14.6 % (ref 11.5–15.5)
WBC: 9.5 10*3/uL (ref 4.0–10.5)
nRBC: 0 % (ref 0.0–0.2)

## 2019-10-04 LAB — BASIC METABOLIC PANEL
Anion gap: 8 (ref 5–15)
BUN: 7 mg/dL (ref 6–20)
CO2: 25 mmol/L (ref 22–32)
Calcium: 8.9 mg/dL (ref 8.9–10.3)
Chloride: 102 mmol/L (ref 98–111)
Creatinine, Ser: 1.05 mg/dL (ref 0.61–1.24)
GFR calc Af Amer: >60 mL/min (ref >60)
GFR calc non Af Amer: >60 mL/min (ref >60)
Glucose, Bld: 90 mg/dL (ref 70–99)
Potassium: 4 mmol/L (ref 3.5–5.1)
Sodium: 135 mmol/L (ref 135–145)

## 2019-10-04 NOTE — Progress Notes (Signed)
[facility] Progress Note  2 Days Post-Op  Subjective: Patient ambulating in hallway and walked back to his room with me, mobilizing well. Patient reports he had another bloody BM overnight. +flatus. Had one episode of nausea overnight but otherwise doing well with CLD. Pain well controlled.   Objective: Vital signs in last 24 hours: Temp:  [98.2 F (36.8 C)-98.8 F (37.1 C)] 98.2 F (36.8 C) (04/29 0509) Pulse Rate:  [62-67] 62 (04/29 0509) Resp:  [14-18] 18 (04/29 0509) BP: (107-121)/(60-73) 107/60 (04/29 0509) SpO2:  [98 %-100 %] 98 % (04/29 0509) Last BM Date: 10/02/19  Intake/Output from previous day: 04/28 0701 - 04/29 0700 In: 840 [P.O.:840] Out: 2325 [Urine:2325] Intake/Output this shift: No intake/output data recorded.  PE: General: pleasant, NAD Heart: regular, rate, and rhythm.  Normal s1,s2. No obvious murmurs, gallops, or rubs noted.  Palpable radial and pedal pulses bilaterally Lungs: CTAB, no wheezes, rhonchi, or rales noted.  Respiratory effort nonlabored Abd: soft, appropriately ttp, ND, +BS, prior colostomy site with penrose and staples present    Lab Results:  Recent Labs    10/03/19 0053 10/04/19 0139  WBC 12.4* 9.5  HGB 12.3* 10.5*  HCT 37.0* 32.1*  PLT 251 221   BMET Recent Labs    10/03/19 0053 10/04/19 0139  NA 135 135  K 4.3 4.0  CL 101 102  CO2 19* 25  GLUCOSE 108* 90  BUN 7 7  CREATININE 1.00 1.05  CALCIUM 9.2 8.9   PT/INR No results for input(s): LABPROT, INR in the last 72 hours. CMP     Component Value Date/Time   NA 135 10/04/2019 0139   K 4.0 10/04/2019 0139   CL 102 10/04/2019 0139   CO2 25 10/04/2019 0139   GLUCOSE 90 10/04/2019 0139   BUN 7 10/04/2019 0139   CREATININE 1.05 10/04/2019 0139   CALCIUM 8.9 10/04/2019 0139   PROT 7.3 10/02/2019 0944   ALBUMIN 4.4 10/02/2019 0944   AST 21 10/02/2019 0944   ALT 16 10/02/2019 0944   ALKPHOS 45 10/02/2019 0944   BILITOT 0.7 10/02/2019 0944   GFRNONAA  >60 10/04/2019 0139   GFRAA >60 10/04/2019 0139   Lipase  No results found for: LIPASE     Studies/Results: No results found.  Anti-infectives: Anti-infectives (From admission, onward)   Start     Dose/Rate Route Frequency Ordered Stop   10/02/19 0945  ceFAZolin (ANCEF) IVPB 2g/100 mL premix     2 g 200 mL/hr over 30 Minutes Intravenous On call to O.R. 10/02/19 0934 10/02/19 1050   10/02/19 0945  metroNIDAZOLE (FLAGYL) IVPB 500 mg     500 mg 100 mL/hr over 60 Minutes Intravenous On call to O.R. 10/02/19 0934 10/02/19 1057   10/02/19 0940  metroNIDAZOLE (FLAGYL) 5-0.79 MG/ML-% IVPB    Note to Pharmacy: Larose Hires, Lindsi   : cabinet override      10/02/19 0940 10/02/19 1057   10/02/19 0940  ceFAZolin (ANCEF) 2-4 GM/100ML-% IVPB    Note to Pharmacy: Larose Hires, Lindsi   : cabinet override      10/02/19 0940 10/02/19 1051       Assessment/Plan S/p colostomy reversal 10/02/19 Dr. Bedelia Person - POD#2 - tolerating CLD - advance to FLD - had another bloody BM but passing some flatus - continue to mobilize - Cr slightly up this AM, continue IVF and repeat BMET in AM  - change dressing over prior stoma site BID  FEN: FLD, IVF VTE: SCDs, lovenox ID: [email or web address]/flagyl  pre-op   LOS: 2 days    Norm Parcel , [facility] 10/04/2019, 11:33 AM Please see Amion for pager number during day hours 7:00am-4:30pm

## 2019-10-05 LAB — CBC
HCT: 29.3 % — ABNORMAL LOW (ref 39.0–52.0)
Hemoglobin: 9.6 g/dL — ABNORMAL LOW (ref 13.0–17.0)
MCH: 26.5 pg (ref 26.0–34.0)
MCHC: 32.8 g/dL (ref 30.0–36.0)
MCV: 80.9 fL (ref 80.0–100.0)
Platelets: 189 10*3/uL (ref 150–400)
RBC: 3.62 MIL/uL — ABNORMAL LOW (ref 4.22–5.81)
RDW: 14.3 % (ref 11.5–15.5)
WBC: 8.5 10*3/uL (ref 4.0–10.5)
nRBC: 0 % (ref 0.0–0.2)

## 2019-10-05 LAB — BASIC METABOLIC PANEL
Anion gap: 9 (ref 5–15)
BUN: 5 mg/dL — ABNORMAL LOW (ref 6–20)
CO2: 24 mmol/L (ref 22–32)
Calcium: 8.6 mg/dL — ABNORMAL LOW (ref 8.9–10.3)
Chloride: 101 mmol/L (ref 98–111)
Creatinine, Ser: 1.02 mg/dL (ref 0.61–1.24)
GFR calc Af Amer: >60 mL/min (ref >60)
GFR calc non Af Amer: >60 mL/min (ref >60)
Glucose, Bld: 85 mg/dL (ref 70–99)
Potassium: 3.7 mmol/L (ref 3.5–5.1)
Sodium: 134 mmol/L — ABNORMAL LOW (ref 135–145)

## 2019-10-05 MED ORDER — POLYETHYLENE GLYCOL 3350 17 G PO PACK: 17.0000 g | PACK | Freq: Every day | ORAL | Status: DC | PRN

## 2019-10-05 NOTE — Progress Notes (Signed)
[facility] Progress Note  3 Days Post-Op  Subjective: Pain controlled. Not eating much - doesn't enjoy full liquids and is a little nervous about digesting the food/having BMs. Mobilzing. +flatus. Reports another BM this morning with a small amt dark blood.   Objective: Vital signs in last 24 hours: Temp:  [98.4 F (36.9 C)-99.1 F (37.3 C)] 98.5 F (36.9 C) (04/30 0428) Pulse Rate:  [62-80] 80 (04/30 0428) Resp:  [18] 18 (04/30 0428) BP: (110-134)/(66-82) 134/82 (04/30 0428) SpO2:  [98 %-100 %] 98 % (04/30 0428) Last BM Date: 10/02/19  Intake/Output from previous day: 04/29 0701 - 04/30 0700 In: 900 [P.O.:900] Out: 700 [Urine:700] Intake/Output this shift: No intake/output data recorded.  PE: General: pleasant, NAD Heart: regular, rate, and rhythm.  Normal s1,s2. No obvious murmurs, gallops, or rubs noted.  Palpable radial and pedal pulses bilaterally Lungs: CTAB, no wheezes, rhonchi, or rales noted.  Respiratory effort nonlabored Abd: soft, appropriately ttp, ND, +BS, prior colostomy site without surrounding erythema or purulent drainage.   Lab Results:  Recent Labs    10/04/19 0139 10/05/19 0151  WBC 9.5 8.5  HGB 10.5* 9.6*  HCT 32.1* 29.3*  PLT 221 189   BMET Recent Labs    10/04/19 0139 10/05/19 0151  NA 135 134*  K 4.0 3.7  CL 102 101  CO2 25 24  GLUCOSE 90 85  BUN 7 5*  CREATININE 1.05 1.02  CALCIUM 8.9 8.6*   PT/INR No results for input(s): LABPROT, INR in the last 72 hours. CMP     Component Value Date/Time   NA 134 (L) 10/05/2019 0151   K 3.7 10/05/2019 0151   CL 101 10/05/2019 0151   CO2 24 10/05/2019 0151   GLUCOSE 85 10/05/2019 0151   BUN 5 (L) 10/05/2019 0151   CREATININE 1.02 10/05/2019 0151   CALCIUM 8.6 (L) 10/05/2019 0151   PROT 7.3 10/02/2019 0944   ALBUMIN 4.4 10/02/2019 0944   AST 21 10/02/2019 0944   ALT 16 10/02/2019 0944   ALKPHOS 45 10/02/2019 0944   BILITOT 0.7 10/02/2019 0944   GFRNONAA >60 10/05/2019  0151   GFRAA >60 10/05/2019 0151   Lipase  No results found for: LIPASE     Studies/Results: No results found.  Anti-infectives: Anti-infectives (From admission, onward)   Start     Dose/Rate Route Frequency Ordered Stop   10/02/19 0945  ceFAZolin (ANCEF) IVPB 2g/100 mL premix     2 g 200 mL/hr over 30 Minutes Intravenous On call to O.R. 10/02/19 0934 10/02/19 1050   10/02/19 0945  metroNIDAZOLE (FLAGYL) IVPB 500 mg     500 mg 100 mL/hr over 60 Minutes Intravenous On call to O.R. 10/02/19 0934 10/02/19 1057   10/02/19 0940  metroNIDAZOLE (FLAGYL) 5-0.79 MG/ML-% IVPB    Note to Pharmacy: Granville Lewis, Lindsi   : cabinet override      10/02/19 0940 10/02/19 1057   10/02/19 0940  ceFAZolin (ANCEF) 2-4 GM/100ML-% IVPB    Note to Pharmacy: Granville Lewis, Lindsi   : cabinet override      10/02/19 0940 10/02/19 1051       Assessment/Plan S/p colostomy reversal 10/02/19 Dr. Bobbye Morton - POD#3 - +flatus, +BMs with small amt dark blood (hgb 9.6 from 10.5) - advance to SOFT diet - continue to mobilize - Cr normalized 1.02 - wound care to stoma site ordered BID  FEN: SOFT, decrease IVF to 50 mL/hr. Plan to DC IVF tomorrow if PO intake improves. VTE: SCDs, lovenox  ID: [email or web address]/flagyl pre-op   LOS: 3 days    Adam Phenix , [facility] 10/05/2019, 10:42 AM Please see Amion for pager number during day hours 7:00am-4:30pm

## 2019-10-06 LAB — BASIC METABOLIC PANEL
Anion gap: 9 (ref 5–15)
BUN: <5 mg/dL — ABNORMAL LOW (ref 6–20)
CO2: 24 mmol/L (ref 22–32)
Calcium: 9.1 mg/dL (ref 8.9–10.3)
Chloride: 102 mmol/L (ref 98–111)
Creatinine, Ser: 0.9 mg/dL (ref 0.61–1.24)
GFR calc Af Amer: >60 mL/min (ref >60)
GFR calc non Af Amer: >60 mL/min (ref >60)
Glucose, Bld: 98 mg/dL (ref 70–99)
Potassium: 3.9 mmol/L (ref 3.5–5.1)
Sodium: 135 mmol/L (ref 135–145)

## 2019-10-06 LAB — CBC
HCT: 30.2 % — ABNORMAL LOW (ref 39.0–52.0)
Hemoglobin: 10.2 g/dL — ABNORMAL LOW (ref 13.0–17.0)
MCH: 26.9 pg (ref 26.0–34.0)
MCHC: 33.8 g/dL (ref 30.0–36.0)
MCV: 79.7 fL — ABNORMAL LOW (ref 80.0–100.0)
Platelets: 205 10*3/uL (ref 150–400)
RBC: 3.79 MIL/uL — ABNORMAL LOW (ref 4.22–5.81)
RDW: 13.7 % (ref 11.5–15.5)
WBC: 7.8 10*3/uL (ref 4.0–10.5)
nRBC: 0 % (ref 0.0–0.2)

## 2019-10-06 MED ORDER — IBUPROFEN 800 MG PO TABS
800.0000 mg | ORAL_TABLET | Freq: Three times a day (TID) | ORAL | 0 refills | Status: AC | PRN
Start: 2019-10-06 — End: ?

## 2019-10-06 MED ORDER — OXYCODONE HCL 5 MG PO TABS: 5.0000 mg | ORAL_TABLET | ORAL | 0 refills | Status: AC | PRN

## 2019-10-06 MED ORDER — METHOCARBAMOL 500 MG PO TABS
500.0000 mg | ORAL_TABLET | Freq: Three times a day (TID) | ORAL | Status: DC
  Administered 2019-10-06: 500 mg via ORAL
  Filled 2019-10-06: qty 1
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
## 2019-10-06 NOTE — Plan of Care (Signed)
VSS Pain 5/10 pain med given. Breathing regular and unlabored on room air.  D/C instructions given and explained with understanding.

## 2019-10-06 NOTE — Progress Notes (Signed)
4 Days Post-Op   Subjective/Chief Complaint: tol diet, had large bm, no n/v, ambulating, pain controlled   Objective: Vital signs in last 24 hours: Temp:  [98.4 F (36.9 C)-99.2 F (37.3 C)] 98.6 F (37 C) (05/01 0800) Pulse Rate:  [60-86] 63 (05/01 0800) Resp:  [15-16] 16 (05/01 0319) BP: (115-122)/(63-72) 115/67 (05/01 0800) SpO2:  [99 %-100 %] 99 % (05/01 0800) Last BM Date: 10/05/19  Intake/Output from previous day: 04/30 0701 - 05/01 0700 In: 237 [P.O.:237] Out: 720 [Urine:720] Intake/Output this shift: No intake/output data recorded.  Resp: clear to auscultation bilaterally Cardio: regular rate and rhythm GI: soft approp tender, stoma site without infection  Lab Results:  Recent Labs    10/05/19 0151 10/06/19 0244  WBC 8.5 7.8  HGB 9.6* 10.2*  HCT 29.3* 30.2*  PLT 189 205   BMET Recent Labs    10/05/19 0151 10/06/19 0244  NA 134* 135  K 3.7 3.9  CL 101 102  CO2 24 24  GLUCOSE 85 98  BUN 5* <5*  CREATININE 1.02 0.90  CALCIUM 8.6* 9.1   PT/INR No results for input(s): LABPROT, INR in the last 72 hours. ABG No results for input(s): PHART, HCO3 in the last 72 hours.  Invalid input(s): PCO2, PO2  Studies/Results: No results found.  Anti-infectives: Anti-infectives (From admission, onward)   Start     Dose/Rate Route Frequency Ordered Stop   10/02/19 0945  ceFAZolin (ANCEF) IVPB 2g/100 mL premix     2 g 200 mL/hr over 30 Minutes Intravenous On call to O.R. 10/02/19 0934 10/02/19 1050   10/02/19 0945  metroNIDAZOLE (FLAGYL) IVPB 500 mg     500 mg 100 mL/hr over 60 Minutes Intravenous On call to O.R. 10/02/19 0934 10/02/19 1057   10/02/19 0940  metroNIDAZOLE (FLAGYL) 5-0.79 MG/ML-% IVPB    Note to Pharmacy: Larose Hires, Lindsi   : cabinet override      10/02/19 0940 10/02/19 1057   10/02/19 0940  ceFAZolin (ANCEF) 2-4 GM/100ML-% IVPB    Note to Pharmacy: Larose Hires, Lindsi   : cabinet override      10/02/19 0940 10/02/19 1051       Assessment/Plan: POD 4 s/p colostomy reversal 10/02/19 Dr. Bedelia Person - hb stable - advance to soft diet - continue to mobilize - Cr normalized 0.90 - wound care to stoma site ordered BID FEN: SOFT, stop iv fluids VTE: SCDs, lovenox Plan dc in am  Emelia Loron 10/06/2019

## 2019-10-06 NOTE — Discharge Instructions (Signed)
May shower beginning 10/06/2019. May allow warm soapy water to run over incision, then rinse and pat dry. Shower at least once daily, may shower more often. Do not soak in any water (tubs, hot tubs, pools, lakes, oceans) for one week after surgery. Keep wound clean and dry, may apply a dry dressing to the wound if drainage is present or if clothing is irritating to the sutures/staples, otherwise keep wound open to air. If applying a dressing, change at least twice daily, may change more frequently as needed. Avoid topical products such as neosporin, bacitracin, etc, to minimize moisture to the area.   No lifting greater than 5 pounds for six weeks.   Please take the stool softener that is prescribed to minimize constipation  Call the office at [phone] for temperature greater than 101.63F, worsening pain, redness or warmth at the incision site.  Please call [phone] to make an appointment for 1 week after surgery for wound check.

## 2019-10-17 ENCOUNTER — Other Ambulatory Visit (HOSPITAL_COMMUNITY): Payer: Self-pay | Admitting: Surgery

## 2019-10-17 ENCOUNTER — Ambulatory Visit (HOSPITAL_BASED_OUTPATIENT_CLINIC_OR_DEPARTMENT_OTHER)
Admission: RE | Admit: 2019-10-17 | Discharge: 2019-10-17 | Disposition: A | Discharge status: Home / Self Care | Payer: Medicaid Other | Source: Ambulatory Visit | Attending: Surgery | Admitting: Surgery

## 2019-10-17 ENCOUNTER — Other Ambulatory Visit: Payer: Self-pay

## 2019-10-17 ENCOUNTER — Other Ambulatory Visit: Payer: Self-pay | Admitting: Surgery

## 2019-10-17 DIAGNOSIS — K632 Fistula of intestine: Secondary | ICD-10-CM

## 2019-10-17 MED ORDER — IOHEXOL 300 MG/ML  SOLN
100.0000 mL | Freq: Once | INTRAMUSCULAR | Status: AC | PRN
  Administered 2019-10-17: 17:00:00 100 mL via INTRAVENOUS

## 2019-10-18 ENCOUNTER — Other Ambulatory Visit (HOSPITAL_COMMUNITY)
Admission: RE | Admit: 2019-10-18 | Discharge: 2019-10-18 | Disposition: A | Discharge status: Home / Self Care | Payer: Medicaid Other | Source: Ambulatory Visit | Attending: Surgery | Admitting: Surgery

## 2019-10-18 ENCOUNTER — Other Ambulatory Visit: Payer: Self-pay

## 2019-10-18 ENCOUNTER — Encounter (HOSPITAL_BASED_OUTPATIENT_CLINIC_OR_DEPARTMENT_OTHER): Payer: Self-pay | Admitting: Surgery

## 2019-10-18 DIAGNOSIS — Z20822 Contact with and (suspected) exposure to covid-19: Secondary | ICD-10-CM | POA: Diagnosis not present

## 2019-10-18 DIAGNOSIS — Z01812 Encounter for preprocedural laboratory examination: Secondary | ICD-10-CM | POA: Insufficient documentation

## 2019-10-18 LAB — SARS CORONAVIRUS 2 (TAT 6-24 HRS): SARS Coronavirus 2: NEGATIVE

## 2019-10-18 NOTE — Anesthesia Preprocedure Evaluation (Addendum)
Anesthesia Evaluation  Patient identified by MRN, date of birth, ID band Patient awake    Reviewed: Allergy & Precautions, NPO status , Patient's Chart, lab work & pertinent test results  Airway Mallampati: II  TM Distance: >3 FB Neck ROM: Full    Dental no notable dental hx. (+) Teeth Intact, Dental Advisory Given   Pulmonary neg pulmonary ROS,    Pulmonary exam normal breath sounds clear to auscultation       Cardiovascular Exercise Tolerance: Good Normal cardiovascular exam Rhythm:Regular Rate:Normal     Neuro/Psych  Headaches, negative psych ROS   GI/Hepatic negative GI ROS, Neg liver ROS,   Endo/Other  negative endocrine ROS  Renal/GU negative Renal ROS     Musculoskeletal negative musculoskeletal ROS (+)   Abdominal   Peds  Hematology negative hematology ROS (+)   Anesthesia Other Findings   Reproductive/Obstetrics                            Anesthesia Physical Anesthesia Plan  ASA: II  Anesthesia Plan: MAC   Post-op Pain Management:    Induction: Intravenous  PONV Risk Score and Plan: 3 and Treatment may vary due to age or medical condition, Ondansetron, Dexamethasone and Midazolam  Airway Management Planned: Natural Airway and Nasal Cannula  Additional Equipment: None  Intra-op Plan:   Post-operative Plan:   Informed Consent: I have reviewed the patients History and Physical, chart, labs and discussed the procedure including the risks, benefits and alternatives for the proposed anesthesia with the patient or authorized representative who has indicated his/her understanding and acceptance.     Dental advisory given  Plan Discussed with: CRNA  Anesthesia Plan Comments:        Anesthesia Quick Evaluation

## 2019-10-19 ENCOUNTER — Ambulatory Visit: Payer: Self-pay | Admitting: General Surgery

## 2019-10-19 ENCOUNTER — Ambulatory Visit (HOSPITAL_BASED_OUTPATIENT_CLINIC_OR_DEPARTMENT_OTHER): Payer: Medicaid Other | Admitting: Anesthesiology

## 2019-10-19 ENCOUNTER — Encounter (HOSPITAL_BASED_OUTPATIENT_CLINIC_OR_DEPARTMENT_OTHER)
Admission: RE | Disposition: A | Discharge status: Home / Self Care | Payer: Self-pay | Source: Home / Self Care | Attending: Surgery

## 2019-10-19 ENCOUNTER — Encounter (HOSPITAL_BASED_OUTPATIENT_CLINIC_OR_DEPARTMENT_OTHER): Payer: Self-pay | Admitting: Surgery

## 2019-10-19 ENCOUNTER — Other Ambulatory Visit: Payer: Self-pay

## 2019-10-19 ENCOUNTER — Ambulatory Visit (HOSPITAL_BASED_OUTPATIENT_CLINIC_OR_DEPARTMENT_OTHER)
Admission: RE | Admit: 2019-10-19 | Discharge: 2019-10-19 | Disposition: A | Discharge status: Home / Self Care | Payer: Medicaid Other | Attending: Surgery | Admitting: Surgery

## 2019-10-19 DIAGNOSIS — L7682 Other postprocedural complications of skin and subcutaneous tissue: Secondary | ICD-10-CM | POA: Diagnosis not present

## 2019-10-19 DIAGNOSIS — X58XXXA Exposure to other specified factors, initial encounter: Secondary | ICD-10-CM | POA: Diagnosis not present

## 2019-10-19 DIAGNOSIS — T82339A Leakage of unspecified vascular graft, initial encounter: Secondary | ICD-10-CM | POA: Diagnosis present

## 2019-10-19 SURGERY — EXAM UNDER ANESTHESIA
Anesthesia: General

## 2019-10-19 MED ORDER — FENTANYL CITRATE (PF) 100 MCG/2ML IJ SOLN
INTRAMUSCULAR | Status: DC | PRN
  Administered 2019-10-19 (×2): 50 ug via INTRAVENOUS

## 2019-10-19 MED ORDER — CHLORHEXIDINE GLUCONATE CLOTH 2 % EX PADS: 6.0000 | MEDICATED_PAD | Freq: Once | CUTANEOUS | Status: DC

## 2019-10-19 MED ORDER — DEXMEDETOMIDINE HCL IN NACL 200 MCG/50ML IV SOLN
INTRAVENOUS | Status: DC | PRN
Start: 2019-10-19 — End: 2019-10-19
  Administered 2019-10-19 (×2): 8 ug via INTRAVENOUS

## 2019-10-19 MED ORDER — MIDAZOLAM HCL 5 MG/5ML IJ SOLN
INTRAMUSCULAR | Status: DC | PRN
  Administered 2019-10-19: 2 mg via INTRAVENOUS

## 2019-10-19 MED ORDER — CEFAZOLIN SODIUM-DEXTROSE 2-4 GM/100ML-% IV SOLN
2.0000 g | INTRAVENOUS | Status: AC
  Administered 2019-10-19: 2 g via INTRAVENOUS

## 2019-10-19 MED ORDER — FENTANYL CITRATE (PF) 100 MCG/2ML IJ SOLN
INTRAMUSCULAR | Status: AC
  Filled 2019-10-19: qty 2

## 2019-10-19 MED ORDER — LACTATED RINGERS IV SOLN: INTRAVENOUS | Status: DC

## 2019-10-19 MED ORDER — LIDOCAINE HCL (PF) 1 % IJ SOLN
INTRAMUSCULAR | Status: DC | PRN
  Administered 2019-10-19: 20 mL

## 2019-10-19 MED ORDER — OXYCODONE HCL 5 MG PO TABS
5.0000 mg | ORAL_TABLET | Freq: Once | ORAL | Status: AC | PRN
  Administered 2019-10-19: 5 mg via ORAL

## 2019-10-19 MED ORDER — LIDOCAINE 2% (20 MG/ML) 5 ML SYRINGE
INTRAMUSCULAR | Status: AC
  Filled 2019-10-19: qty 5

## 2019-10-19 MED ORDER — OXYCODONE HCL 5 MG PO TABS: 5.0000 mg | ORAL_TABLET | Freq: Four times a day (QID) | ORAL | 0 refills | Status: AC | PRN

## 2019-10-19 MED ORDER — ENOXAPARIN SODIUM 40 MG/0.4ML ~~LOC~~ SOLN
40.0000 mg | Freq: Once | SUBCUTANEOUS | Status: AC
  Administered 2019-10-19: 40 mg via SUBCUTANEOUS

## 2019-10-19 MED ORDER — FENTANYL CITRATE (PF) 100 MCG/2ML IJ SOLN: 25.0000 ug | INTRAMUSCULAR | Status: DC | PRN

## 2019-10-19 MED ORDER — MIDAZOLAM HCL 2 MG/2ML IJ SOLN
INTRAMUSCULAR | Status: AC
  Filled 2019-10-19: qty 2

## 2019-10-19 MED ORDER — KETOROLAC TROMETHAMINE 30 MG/ML IJ SOLN: 30.0000 mg | Freq: Once | INTRAMUSCULAR | Status: DC | PRN

## 2019-10-19 MED ORDER — ONDANSETRON HCL 4 MG/2ML IJ SOLN
INTRAMUSCULAR | Status: DC | PRN
  Administered 2019-10-19: 4 mg via INTRAVENOUS

## 2019-10-19 MED ORDER — OXYCODONE HCL 5 MG PO TABS
ORAL_TABLET | ORAL | Status: AC
  Filled 2019-10-19: qty 1

## 2019-10-19 MED ORDER — ENOXAPARIN SODIUM 40 MG/0.4ML ~~LOC~~ SOLN
SUBCUTANEOUS | Status: AC
  Filled 2019-10-19: qty 0.4

## 2019-10-19 MED ORDER — PROPOFOL 500 MG/50ML IV EMUL
INTRAVENOUS | Status: DC | PRN
  Administered 2019-10-19: 125 ug/kg/min via INTRAVENOUS

## 2019-10-19 MED ORDER — OXYCODONE HCL 5 MG/5ML PO SOLN: 5.0000 mg | Freq: Once | ORAL | Status: AC | PRN

## 2019-10-19 MED ORDER — ONDANSETRON HCL 4 MG/2ML IJ SOLN: 4.0000 mg | Freq: Once | INTRAMUSCULAR | Status: DC | PRN

## 2019-10-19 MED ORDER — ONDANSETRON HCL 4 MG/2ML IJ SOLN
INTRAMUSCULAR | Status: AC
  Filled 2019-10-19: qty 2

## 2019-10-19 MED ORDER — PROPOFOL 10 MG/ML IV BOLUS
INTRAVENOUS | Status: DC | PRN
  Administered 2019-10-19: 20 mg via INTRAVENOUS
  Administered 2019-10-19: 30 mg via INTRAVENOUS
  Administered 2019-10-19: 20 mg via INTRAVENOUS

## 2019-10-19 MED ORDER — CEFAZOLIN SODIUM-DEXTROSE 2-4 GM/100ML-% IV SOLN
INTRAVENOUS | Status: AC
  Filled 2019-10-19: qty 100

## 2019-10-19 MED ORDER — OXYCODONE HCL 5 MG PO TABS: 5.0000 mg | ORAL_TABLET | Freq: Once | ORAL | Status: DC | PRN

## 2019-10-19 MED ORDER — FENTANYL CITRATE (PF) 100 MCG/2ML IJ SOLN
25.0000 ug | INTRAMUSCULAR | Status: DC | PRN
  Administered 2019-10-19: 50 ug via INTRAVENOUS

## 2019-10-19 MED ORDER — OXYCODONE HCL 5 MG/5ML PO SOLN: 5.0000 mg | Freq: Once | ORAL | Status: DC | PRN

## 2019-10-19 MED ORDER — DEXAMETHASONE SODIUM PHOSPHATE 10 MG/ML IJ SOLN
INTRAMUSCULAR | Status: AC
  Filled 2019-10-19: qty 1

## 2019-10-19 MED ORDER — PROPOFOL 500 MG/50ML IV EMUL
INTRAVENOUS | Status: AC
  Filled 2019-10-19: qty 50

## 2019-10-19 SURGICAL SUPPLY — 32 items
BLADE SURG 10 STRL SS (BLADE) ×3 IMPLANT
BLADE SURG 15 STRL LF DISP TIS (BLADE) ×1 IMPLANT
BLADE SURG 15 STRL SS (BLADE) ×2
BNDG GAUZE ELAST 4 BULKY (GAUZE/BANDAGES/DRESSINGS) ×3 IMPLANT
CANISTER SUCT 1200ML W/VALVE (MISCELLANEOUS) ×3 IMPLANT
COVER BACK TABLE 60X90IN (DRAPES) ×3 IMPLANT
COVER MAYO STAND STRL (DRAPES) ×3 IMPLANT
DRAPE LAPAROTOMY 100X72 PEDS (DRAPES) ×3 IMPLANT
DRAPE UTILITY XL STRL (DRAPES) ×3 IMPLANT
ELECT COATED BLADE 2.86 ST (ELECTRODE) ×3 IMPLANT
ELECT REM PT RETURN 9FT ADLT (ELECTROSURGICAL) ×3
ELECTRODE REM PT RTRN 9FT ADLT (ELECTROSURGICAL) ×1 IMPLANT
GAUZE SPONGE 4X4 12PLY STRL LF (GAUZE/BANDAGES/DRESSINGS) ×3 IMPLANT
GLOVE BIO SURGEON STRL SZ 6.5 (GLOVE) ×2 IMPLANT
GLOVE BIO SURGEONS STRL SZ 6.5 (GLOVE) ×1
GLOVE BIOGEL PI IND STRL 6 (GLOVE) ×1 IMPLANT
GLOVE BIOGEL PI INDICATOR 6 (GLOVE) ×2
GOWN STRL REUS W/ TWL LRG LVL3 (GOWN DISPOSABLE) ×2 IMPLANT
GOWN STRL REUS W/TWL LRG LVL3 (GOWN DISPOSABLE) ×4
NEEDLE HYPO 25X1 1.5 SAFETY (NEEDLE) ×3 IMPLANT
NS IRRIG 1000ML POUR BTL (IV SOLUTION) ×3 IMPLANT
PENCIL SMOKE EVACUATOR (MISCELLANEOUS) ×3 IMPLANT
SET BASIN DAY SURGERY F.S. (CUSTOM PROCEDURE TRAY) ×3 IMPLANT
SLEEVE SCD COMPRESS KNEE MED (MISCELLANEOUS) ×3 IMPLANT
SPONGE LAP 18X18 RF (DISPOSABLE) ×3 IMPLANT
SYR BULB IRRIG 60ML STRL (SYRINGE) ×3 IMPLANT
SYR CONTROL 10ML LL (SYRINGE) ×3 IMPLANT
TOWEL GREEN STERILE FF (TOWEL DISPOSABLE) ×3 IMPLANT
TUBE CONNECTING 20'X1/4 (TUBING) ×1
TUBE CONNECTING 20X1/4 (TUBING) ×2 IMPLANT
UNDERPAD 30X36 HEAVY ABSORB (UNDERPADS AND DIAPERS) ×3 IMPLANT
YANKAUER SUCT BULB TIP NO VENT (SUCTIONS) ×3 IMPLANT

## 2019-10-19 NOTE — Transfer of Care (Signed)
Immediate Anesthesia Transfer of Care Note  Patient: Austin Morales  Procedure(s) Performed: EXAM UNDER ANESTHESIA LIMITED WOUND EXPLORATION OF PRIOR OSTOMY SITE (N/A )  Patient Location: PACU  Anesthesia Type:MAC  Level of Consciousness: awake, alert  and oriented  Airway & Oxygen Therapy: Patient Spontanous Breathing and Patient connected to face mask oxygen  Post-op Assessment: Report given to RN and Post -op Vital signs reviewed and stable  Post vital signs: Reviewed and stable  Last Vitals:  Vitals Value Taken Time  BP 107/64 10/19/19 1135  Temp    Pulse 65 10/19/19 1138  Resp 19 10/19/19 1138  SpO2 100 % 10/19/19 1138  Vitals shown include unvalidated device data.  Last Pain:  Vitals:   10/19/19 0942  TempSrc: Tympanic  PainSc: 0-No pain         Complications: No apparent anesthesia complications
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
## 2019-10-19 NOTE — Discharge Instructions (Addendum)
Dressing changes twice daily with moistened gauze packed loosely into the wound and covered with dry gauze and tape. Okay to shower, but do not soak in any water such as a tub or pool. Try to avoid getting water into the wound during bathing. Call the office at [phone] with any questions or concerns and if you have a temperature greater than 101.28F, or is there is redness or increased pain at the site.   You can send pictures of your wound or any drainage you notice to [email or web address]   Call your surgeon if you experience:   1.  Fever over 101.0. 2.  Inability to urinate. 3.  Nausea and/or vomiting. 4.  Extreme swelling or bruising at the surgical site. 5.  Continued bleeding from the incision. 6.  Increased pain, redness or drainage from the incision. 7.  Problems related to your pain medication. 8.  Any problems and/or concerns6.  Increased pain, redness or drainage from the incision.    You received oxycodone 5 mg at 1:30 pm , you can repeat again after 5:30 pm if needed   Post Anesthesia Home Care Instructions  Activity: Get plenty of rest for the remainder of the day. A responsible individual must stay with you for 24 hours following the procedure.  For the next 24 hours, DO NOT: -Drive a car -Advertising copywriter -Drink alcoholic beverages -Take any medication unless instructed by your physician -Make any legal decisions or sign important papers.  Meals: Start with liquid foods such as gelatin or soup. Progress to regular foods as tolerated. Avoid greasy, spicy, heavy foods. If nausea and/or vomiting occur, drink only clear liquids until the nausea and/or vomiting subsides. Call your physician if vomiting continues.  Special Instructions/Symptoms: Your throat may feel dry or sore from the anesthesia or the breathing tube placed in your throat during surgery. If this causes discomfort, gargle with warm salt water. The discomfort should disappear within 24  hours.  If you had a scopolamine patch placed behind your ear for the management of post- operative nausea and/or vomiting:  1. The medication in the patch is effective for 72 hours, after which it should be removed.  Wrap patch in a tissue and discard in the trash. Wash hands thoroughly with soap and water. 2. You may remove the patch earlier than 72 hours if you experience unpleasant side effects which may include dry mouth, dizziness or visual disturbances. 3. Avoid touching the patch. Wash your hands with soap and water after contact with the patch.

## 2019-10-19 NOTE — Anesthesia Postprocedure Evaluation (Signed)
Anesthesia Post Note  Patient: Austin Morales  Procedure(s) Performed: EXAM UNDER ANESTHESIA LIMITED WOUND EXPLORATION OF PRIOR OSTOMY SITE (N/A )     Patient location during evaluation: PACU Anesthesia Type: MAC Level of consciousness: awake and alert Pain management: pain level controlled Vital Signs Assessment: post-procedure vital signs reviewed and stable Respiratory status: spontaneous breathing, nonlabored ventilation, respiratory function stable and patient connected to nasal cannula oxygen Cardiovascular status: stable and blood pressure returned to baseline Postop Assessment: no apparent nausea or vomiting Anesthetic complications: no    Last Vitals:  Vitals:   10/19/19 1300 10/19/19 1345  BP: 107/68 109/66  Pulse: (!) 51 78  Resp: 16 14  Temp: 36.8 C 36.8 C  SpO2: 100% 100%    Last Pain:  Vitals:   10/19/19 1253  TempSrc:   PainSc: 3                  Barnet Glasgow

## 2019-10-21 NOTE — Op Note (Addendum)
   Operative Note   Date: 10/19/2019  Procedure: local wound exploration  Pre-op diagnosis: anastomotic leak Post-op diagnosis: same  Indication and clinical history: The patient is a 19 y.o. year old male with history of recent colostomy reversal and new onset of passage of air from prior colostomy takedown site. Sub-fascial fluid collection seen on CT.      Surgeon: Jesusita Oka, MD  Anesthesiologist: Dr. Valma Cava Anesthesia: MAC  Findings:  . Specimen: none . EBL: <5cc . Drains/Implants: none  Disposition: PACU - hemodynamically stable.  Description of procedure: The patient was positioned supine on the operating room table. MAC anesthetic induction was uneventful. Time-out was performed verifying correct patient, procedure, signature of informed consent, and administration of pre-operative antibiotics.   The previously placed PDS sutures were removed from the fascia. Digital exploration was performed as well as an attempt at ultrasound guided identification of CT visualized fluid collection. No purulent or feculent drainage was encountered. The fascia was left open with the expectation of a planned ventral hernia which was discussed with the patient pre-operatively. Local anesthetic was infiltrated at the level of the fascia and the wound loosely packed with moistened kerlix.   Sterile dressings were applied. All sponge and instrument counts were correct at the conclusion of the procedure. The patient was awakened from anesthesia and transported to the PACU in good condition. There were no complications.   Patient and mother notified of findings and plan in the PACU.   Jesusita Oka, MD General and [facility]

## 2019-10-24 ENCOUNTER — Other Ambulatory Visit: Payer: Self-pay | Admitting: Surgery

## 2019-10-24 ENCOUNTER — Other Ambulatory Visit (HOSPITAL_COMMUNITY): Payer: Self-pay | Admitting: Surgery

## 2019-10-24 DIAGNOSIS — Z9889 Other specified postprocedural states: Secondary | ICD-10-CM

## 2019-10-25 ENCOUNTER — Other Ambulatory Visit: Payer: Self-pay

## 2019-10-25 ENCOUNTER — Ambulatory Visit (HOSPITAL_COMMUNITY)
Admission: RE | Admit: 2019-10-25 | Discharge: 2019-10-25 | Disposition: A | Discharge status: Home / Self Care | Payer: Medicaid Other | Source: Ambulatory Visit | Attending: Surgery | Admitting: Surgery

## 2019-10-25 DIAGNOSIS — Z9889 Other specified postprocedural states: Secondary | ICD-10-CM | POA: Diagnosis present

## 2019-10-25 MED ORDER — IOHEXOL 9 MG/ML PO SOLN
ORAL | Status: AC
  Filled 2019-10-25: qty 1000

## 2019-10-25 MED ORDER — IOHEXOL 300 MG/ML  SOLN
100.0000 mL | Freq: Once | INTRAMUSCULAR | Status: AC | PRN
  Administered 2019-10-25: 100 mL via INTRAVENOUS

## 2019-10-25 MED ORDER — SODIUM CHLORIDE (PF) 0.9 % IJ SOLN
INTRAMUSCULAR | Status: AC
  Filled 2019-10-25: qty 50
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
